# Patient Record
Sex: Male | Born: 1970 | Race: White | Hispanic: No | Smoking: Current every day smoker
Health system: Southern US, Community
[De-identification: ages and names within clinical notes are randomized; demographics above are authoritative.]

## PROBLEM LIST (undated history)

## (undated) DIAGNOSIS — I340 Nonrheumatic mitral (valve) insufficiency: Secondary | ICD-10-CM

## (undated) DIAGNOSIS — G43109 Migraine with aura, not intractable, without status migrainosus: Secondary | ICD-10-CM

## (undated) DIAGNOSIS — J45909 Unspecified asthma, uncomplicated: Secondary | ICD-10-CM

## (undated) DIAGNOSIS — Z720 Tobacco use: Secondary | ICD-10-CM

## (undated) DIAGNOSIS — G8929 Other chronic pain: Secondary | ICD-10-CM

## (undated) DIAGNOSIS — F32A Depression, unspecified: Secondary | ICD-10-CM

## (undated) DIAGNOSIS — I10 Essential (primary) hypertension: Secondary | ICD-10-CM

---

## 1997-12-31 ENCOUNTER — Emergency Department (HOSPITAL_COMMUNITY): Admission: EM | Admit: 1997-12-31 | Discharge: 1997-12-31 | Payer: Self-pay | Admitting: Emergency Medicine

## 1998-01-06 ENCOUNTER — Ambulatory Visit (HOSPITAL_BASED_OUTPATIENT_CLINIC_OR_DEPARTMENT_OTHER): Admission: RE | Admit: 1998-01-06 | Discharge: 1998-01-06 | Payer: Self-pay | Admitting: Otolaryngology

## 2000-02-18 ENCOUNTER — Encounter: Payer: Self-pay | Admitting: Emergency Medicine

## 2000-02-18 ENCOUNTER — Emergency Department (HOSPITAL_COMMUNITY): Admission: EM | Admit: 2000-02-18 | Discharge: 2000-02-18 | Payer: Self-pay | Admitting: Emergency Medicine

## 2001-03-01 ENCOUNTER — Emergency Department (HOSPITAL_COMMUNITY): Admission: EM | Admit: 2001-03-01 | Discharge: 2001-03-01 | Payer: Self-pay | Admitting: Emergency Medicine

## 2003-08-07 ENCOUNTER — Emergency Department (HOSPITAL_COMMUNITY): Admission: EM | Admit: 2003-08-07 | Discharge: 2003-08-07 | Payer: Self-pay | Admitting: Emergency Medicine

## 2005-09-18 ENCOUNTER — Inpatient Hospital Stay (HOSPITAL_COMMUNITY): Admission: EM | Admit: 2005-09-18 | Discharge: 2005-09-19 | Payer: Self-pay | Admitting: Emergency Medicine

## 2005-09-19 ENCOUNTER — Inpatient Hospital Stay (HOSPITAL_COMMUNITY): Admission: AD | Admit: 2005-09-19 | Discharge: 2005-09-24 | Payer: Self-pay | Admitting: *Deleted

## 2005-09-19 ENCOUNTER — Ambulatory Visit: Payer: Self-pay | Admitting: *Deleted

## 2009-09-25 ENCOUNTER — Emergency Department (HOSPITAL_COMMUNITY): Admission: EM | Admit: 2009-09-25 | Discharge: 2009-09-25 | Payer: Self-pay | Admitting: Emergency Medicine

## 2010-08-30 LAB — COMPREHENSIVE METABOLIC PANEL
ALT: 38 U/L (ref 0–53)
AST: 34 U/L (ref 0–37)
Albumin: 4.4 g/dL (ref 3.5–5.2)
Alkaline Phosphatase: 90 U/L (ref 39–117)
BUN: 9 mg/dL (ref 6–23)
CO2: 28 mEq/L (ref 19–32)
Calcium: 9 mg/dL (ref 8.4–10.5)
Chloride: 102 mEq/L (ref 96–112)
Creatinine, Ser: 0.9 mg/dL (ref 0.4–1.5)
GFR calc Af Amer: >60 mL/min (ref >60)
GFR calc non Af Amer: >60 mL/min (ref >60)
Glucose, Bld: 100 mg/dL — ABNORMAL HIGH (ref 70–99)
Potassium: 4 mEq/L (ref 3.5–5.1)
Sodium: 137 mEq/L (ref 135–145)
Total Bilirubin: 0.8 mg/dL (ref 0.3–1.2)
Total Protein: 7.9 g/dL (ref 6.0–8.3)

## 2010-08-30 LAB — URINALYSIS, ROUTINE W REFLEX MICROSCOPIC
Bilirubin Urine: NEGATIVE
Glucose, UA: NEGATIVE mg/dL
Hgb urine dipstick: NEGATIVE
Ketones, ur: NEGATIVE mg/dL
Nitrite: NEGATIVE
Protein, ur: NEGATIVE mg/dL
Specific Gravity, Urine: 1.019 (ref 1.005–1.030)
Urobilinogen, UA: 0.2 mg/dL (ref 0.0–1.0)
pH: 5.5 (ref 5.0–8.0)

## 2010-08-30 LAB — CBC
HCT: 52.1 % — ABNORMAL HIGH (ref 39.0–52.0)
Hemoglobin: 17.5 g/dL — ABNORMAL HIGH (ref 13.0–17.0)
MCHC: 33.6 g/dL (ref 30.0–36.0)
MCV: 101.1 fL — ABNORMAL HIGH (ref 78.0–100.0)
Platelets: 221 10*3/uL (ref 150–400)
RBC: 5.16 MIL/uL (ref 4.22–5.81)
RDW: 14.3 % (ref 11.5–15.5)
WBC: 11.4 10*3/uL — ABNORMAL HIGH (ref 4.0–10.5)

## 2010-08-30 LAB — POCT I-STAT, CHEM 8
Chloride: 101 mEq/L (ref 96–112)
Glucose, Bld: 99 mg/dL (ref 70–99)
HCT: 58 % — ABNORMAL HIGH (ref 39.0–52.0)
Hemoglobin: 19.7 g/dL — ABNORMAL HIGH (ref 13.0–17.0)
Potassium: 4.1 mEq/L (ref 3.5–5.1)
Sodium: 139 mEq/L (ref 135–145)

## 2010-08-30 LAB — DIFFERENTIAL
Basophils Absolute: 0.1 10*3/uL (ref 0.0–0.1)
Lymphocytes Relative: 16 % (ref 12–46)
Monocytes Absolute: 1.3 10*3/uL — ABNORMAL HIGH (ref 0.1–1.0)
Neutro Abs: 8.1 10*3/uL — ABNORMAL HIGH (ref 1.7–7.7)
Neutrophils Relative %: 71 % (ref 43–77)

## 2010-08-30 LAB — LIPASE, BLOOD: Lipase: 25 U/L (ref 11–59)

## 2010-10-28 NOTE — Discharge Summary (Signed)
[name], [name] NO.:  [id number]   MEDICAL RECORD NO.:  [id number]          PATIENT TYPE:  IPS   LOCATION:  0306                          FACILITY:  [facility]   PHYSICIAN:  Jasmine Pang, M.D. DATE OF BIRTH:  Oct 10, 1970   DATE OF ADMISSION:  09/19/2005  DATE OF DISCHARGE:  09/24/2005                                 DISCHARGE SUMMARY   IDENTIFICATION:  This is a 40 year old single Caucasian male who was  admitted on a voluntary basis on September 19, 2005.   HISTORY OF PRESENT ILLNESS:  The patient has a history of overdose on Xanax.  He was also drinking seven beers.  He stated he was wanting attention.  He  felt his girlfriend was cheating on him.  He states that he actually spit  the Xanax out.  He may have swallowed some, approximately 1-2 of the (?  Dose).  He was recently started on medications.  He states he never intends  to do this again.  He has too much to live for and wants to be a better  father.  He discussed his depression.  He says he assumes things are  happening that are not, for example feeling his girlfriend was cheating on  him when she was not.  There were a lot of major stressors building up.  He  had been alienated from his mother who has depression.  He was started on  Prozac last week by his primary care doctor.  He has also been alienated  from his father.  In addition, he has had been out of work for two weeks.   PAST PSYCHIATRIC HISTORY:  This is the first Behavioral Health admission for  patient.  He is seen by a clinical psychologist, Dr. Gretchen Short.  He has had  no other suicide attempts.   FAMILY HISTORY:  Mother has depression.   SUBSTANCE ABUSE HISTORY:  Smokes.  Occasional alcohol.  Denies drug use.   PRIMARY CARE PHYSICIAN:  Dr. Tenny Craw.   MEDICAL PROBLEMS:  The patient is healthy.   MEDICATIONS:  Prozac 20 mg for the past week.   ALLERGIES:  MECLIZINE.   PHYSICAL EXAMINATION:  A physical exam was done by our nurse  practitioner,  Landry Corporal, N.P.  There were no acute medical problems.   LABORATORY DATA:  UDS was positive for benzodiazepines.  Alcohol was 195,  salicylate less than 4, Tylenol less than 10, magnesium 2.4.   HOSPITAL COURSE:  Upon admission, the patient was placed on Protonix 40 mg  p.o. q.d., multivitamin with minerals 1 tablet p.o. q.d., Prozac 20 mg p.o.  q.d., Librium 25 mg p.o. q.6h. p.r.n. anxiety or symptoms of withdrawal,  Ambien 5 mg 1 p.o. q.h.s. p.r.n., may repeat x1 if needed.  On September 19, 2005, the Ambien was discontinued.  He was placed on trazodone 50 mg q.h.s.  p.r.n. insomnia, may repeat x1 if necessary.  The results on order that the  patient could use Nicorette gum.  However, later on September 19, 2005, the  nicotine gum was discontinued as he stated it was hurting  his jaws.  Instead, a nicotine patch 21 mg was applied and smoking cessation protocol  begun.  On September 21, 2005, he was started on ibuprofen 600 mg p.o. q.6h.  p.r.n. pain.  On September 23, 2005, trazodone was increased to 100 mg p.o.  q.h.s. p.r.n. insomnia.   The patient talked openly about his depression.  He admitted that his  perceptions are somewhat distorted, for example he thinks his girlfriend is  cheating when she is not.  He discussed his multiple psychosocial stressors  as listed in the history of present illness.  He minimized his alcohol use.  On September 21, 2005, I met with the patient and his sister, who gave a more  thorough history of his severe alcohol dependence and binges.  Prior to  admission, he took a whole bottle of Xanax and drank liquor.  The patient  was not happy that his sister was revealing this but remained cooperative.  The patient continued to be focused on discharge.  He likes Dr. Grant Fontana.  He  states he was still somewhat in denial about his recovery.  He felt he would  do well because he had hit bottom.  I advised there was a lot of work to do  in order to recover.  His  sister had told him that his girlfriend was  leaving and he stated he had suspected this was going to happen.   On September 22, 2005, the counselor met with the patient and the patient's  sister.  The patient stated he did not have a desire to drink anymore.  He  stated he had been drinking for about 16 years.  The patient's sister stated  that she wants to be very involved in the patient's recovery.  She lives  next door to the patient and she wants him to follow up with his  appointments and go to his AA meetings.  He told her he did not want her to  pressure him with his recovery.  He stated he had just ended a 3-year  relationship with his girlfriend and that has been very difficult for him to  deal with.  He also stated he wanted to be a better father to his daughter.  He is planning to find another job because the job he has is very stressful  for him.  He stated he would not be around his friends that drink.  The  patient's sister and their mother are very supportive.   At the time of discharge, the patient's mental status had improved markedly.  He had less psychomotor retardation and was more engaged.  His mood had  become less depressed and anxious.  His affect was wider range.  He denies  suicidal or homicidal ideation.  There were no auditory or visual  hallucinations.  There was no paranoia or delusions noted.  Thought  processes were logical and goal-directed.  Thought content:  wants to get  out of the hospital.  Cognitive was grossly within normal limits.  At the  time of discharge, the patient was scheduled to go to the CD IOP.  He has a  relapse prevention plan in place.   ACTIVITY/DIET:  There were no specific activity level or dietary  restrictions.   DISCHARGE MEDICATIONS:  1.  Protonix 40 mg q.d.  2.  Prozac 20 mg q.d.  3.  Trazodone 100 mg p.o. q.h.s.   POST-HOSPITAL CARE PLANS:  [facility] IOP at the [facility].  He was to start  September 25, 2005 at 10:30 a.m. for  registration.  After the CD IOP program, he will continue his treatment with Dr. Gretchen Short  and will also see Dr. Lolly Mustache for medication management.      Jasmine Pang, M.D.  Electronically Signed     BHS/MEDQ  D:  10/10/2005  T:  10/10/2005  Job:  [id number]

## 2010-10-28 NOTE — H&P (Signed)
Martin Mendoza, Martin Mendoza             ACCOUNT NO.:  [id number]   MEDICAL RECORD NO.:  [id number]          PATIENT TYPE:  EMS   LOCATION:  MAJO                         FACILITY:  [facility]   PHYSICIAN:  Isidor Holts, M.D.  DATE OF BIRTH:  [birth year]   DATE OF ADMISSION:  09/17/2005  DATE OF DISCHARGE:                                HISTORY & PHYSICAL   PRIMARY CARE PHYSICIAN:  Unassigned.   CHIEF COMPLAINT:  Overdose of several tablets of Xanax, with alcohol  ingestion.   HISTORY OF PRESENT ILLNESS:  This is a 40 year old male.  History is not  very clear, as the patient is very drowsy and not communicative.  However,  what is known of the history is gleaned from the emergency department M.D.  and also the EMS report.  It appears that family called EMS after the  patient had ingested approximately 25 tablets of 0.5 mg Xanax, following  trouble with his girlfriend.  He also consumed an unspecified amount of  alcohol.  He was very drowsy on initial evaluation in the emergency  department, but did not appear to be in obvious respiratory distress.  There  was no vomiting.   PAST MEDICAL HISTORY:  1.  Depression.  2.  Previous history of suicide attempt/Drug overdose.  3.  Alcohol abuse.   MEDICATIONS:  1.  Prozac, query dosage.  2.  Xanax, query dosage.   ALLERGIES:  No known drug allergies.   REVIEW OF SYSTEMS:  Unobtainable at present.   FAMILY HISTORY:   SOCIAL HISTORY:  Unobtainable at present.   PHYSICAL EXAMINATION:  VITAL SIGNS:  Temperature 99.0, pulse 87 per minute  and regular, respiratory rate 28, blood pressure 124/77 mmHg, pulse oximetry  97% on room air.  GENERAL:  The patient does not appear to be in obvious acute respiratory  distress, however, he is very drowsy and somewhat difficult to rouse.  Once  roused, he quickly falls asleep again.  He is therefore not able to  contribute history.  HEENT:  No clinical pallor, no jaundice, and no conjunctival  injection.  NECK:  Supple, JVP not seen, no palpable lymphadenopathy, and no palpable  goiter.  CHEST:  Clinically clear to auscultation.  No wheezes and no crackles.  HEART:  Heart sounds 1 and 2 heard, normal, regular, no murmurs.  ABDOMEN:  Flat, soft, and nontender with no palpable organomegaly.  No  palpable masses.  Normal bowel sounds.  EXTREMITIES:  Quiet unremarkable.  MUSCULOSKELETAL:  Not formally examined, however, appears unremarkable.  NEUROLOGY:  Apart from drowsiness, no focal neurologic deficit is elicited.   LABORATORY DATA:  CBC; WBC 10.9, hemoglobin 15.9, hematocrit 45.4, platelets  295.  Electrolytes; sodium 137, potassium 3.9, chloride 106, CO2 24.9, BUN  8, creatinine 0.9, glucose 88.  LFT's are normal.  Urine drug screen is  positive only for benzodiazepines, acetaminophen is less than 10.0,  salicylate less than 4.0, alcohol level is 195.   ASSESSMENT:  1.  Benzodiazepine overdose.  The patient does not appear to be in      respiratory distress at present.  Lung fields are  clear.  We shall admit      to telemetry for observation.   1.  Suicide attempt.  The patient will need one-on-one sitter.  We shall      request psychiatry consultation, once the patient is medically stable.   1.  Depression. See #2 above.   1.  Acute alcoholic intoxication/Alcohol abuse.  We will place the patient      on intravenous fluids, vitamin supplements, watch for alcohol withdrawal      phenomena and institute appropriate counseling.  Further management will depend on clinical course.      Isidor Holts, M.D.  Electronically Signed     CO/MEDQ  D:  09/18/2005  T:  09/18/2005  Job:  [id number]

## 2010-10-28 NOTE — Discharge Summary (Signed)
Martin Mendoza, Martin Mendoza             ACCOUNT NO.:  [id number]   MEDICAL RECORD NO.:  [id number]          PATIENT TYPE:  INP   LOCATION:  4711                         FACILITY:  [facility]   PHYSICIAN:  Hillery Aldo, M.D.   DATE OF BIRTH:  1971/01/15   DATE OF ADMISSION:  09/17/2005  DATE OF DISCHARGE:                           DISCHARGE SUMMARY - REFERRING   DATE OF DISCHARGE:  Pending psychiatric bed availability but cleared for  discharge on September 18, 2005.   PRIMARY CARE PHYSICIAN:  Dr. Tenny Craw.   DISCHARGE DIAGNOSES:  1.  Benzodiazepine overdose.  2.  Alcohol intoxication.  3.  Suicidal ideation and attempt.  4.  Depression.  5.  Tobacco abuse.   DISCHARGE MEDICATIONS:  1.  Thiamine 100 mg daily.  2.  Multivitamin daily.  3.  Prozac 20 mg daily.  4.  Ativan per alcohol withdrawal management protocol.   CONSULTATION:  Antonietta Breach, M.D., psychiatry.   PROCEDURES/DIAGNOSTIC STUDIES:  None.   DISCHARGE LABORATORY DATA:  Urine drug screen was positive for  benzodiazepines.  Alcohol level was 195.  Salicylates were less than 4.  Acetaminophen less than 10.  Other laboratory values as per admission HPI.   HOSPITAL COURSE BY PROBLEM:  PROBLEM #1 -  SUICIDE ATTEMPT/BENZODIAZEPINE  OVERDOSE/ALCOHOL INTOXICATION:  Patient was admitted for observation.  He  was initially very drowsy and was kept on close observation status with a  1:1 sitter.  He was monitored for signs of alcohol withdrawal.  He was put  on a alcohol withdrawal protocol.  Once he was awake and alert, he was seen  by Eduard Roux, NP, of psychiatry.  Felt that the patient met the criteria  for inpatient psychiatric commitment.  Paperwork was therefore completed for  petition to commitment.  At This point, he is stable from a medical  standpoint for transfer to inpatient psychiatric facility.  We are awaiting  a bed offer at this time.   DISPOSITION:  Once the patient has a bed offer, he will be transferred to an  inpatient psychiatric facility for ongoing management of his psychiatric  issues.           ______________________________  Hillery Aldo, M.D.     CR/MEDQ  D:  09/18/2005  T:  09/18/2005  Job:  [id number]   cc:   Dr. Tenny Craw

## 2010-10-28 NOTE — H&P (Signed)
Martin Mendoza, Martin Mendoza NO.:  [id number]   MEDICAL RECORD NO.:  [id number]          PATIENT TYPE:  IPS   LOCATION:  0306                          [facility]ILITY[facility]  [facility]   PHYSICIAN:  Martin Mendoza, M.D. DATE OF BIRTH:  02/15/1971   DATE OF ADMISSION:  09/19/2005  DATE OF DISCHARGE:                         PSYCHIATRIC ADMISSION ASSESSMENT   IDENTIFYING INFORMATION:  A 40 year old single white male, voluntarily  admitted September 19, 2005.   HISTORY OF PRESENT ILLNESS:  The patient presents with a history of  intentional overdose, overdosing on Xanax.  The patient has been drinking  about 7 beers.  The patient states that he did this because he wanted  attention.  He felt that his girlfriend was cheating on him.  He states that  he actually only took a few Xanax and spit the rest out, swallowing only  maybe one or two.  The patient states he recently started on some  medications.  He states that he would never attempt to harm himself again.  He states he has too much to live for.  He wants to be a better father and  wants to stop drinking.  Denies any psychotic symptoms.   PAST PSYCHIATRIC HISTORY:  This is his first admission to [facility], no other psychiatric admissions or any other suicide attempts or  gestures.  He is seeing a clinical psychologist, Dr. Gretchen Short.   SOCIAL HISTORY:  He is a 40 year old single white male, has a 39 year old  child.  The child is with the mother of that child.  He was living with his  girlfriend.  He works as a Tax adviser.   FAMILY HISTORY:  Mother with depression .   ALCOHOL DRUG HISTORY:  The patient smokes, he states he drinks on occasion,  denies any drug use.   PAST MEDICAL HISTORY:  Primary care Tandi Hanko is Dr. Tenny Craw.  Medical problems:  The patient states he is healthy, no chronic illness.   MEDICATIONS:  Has been on Prozac 20 mg for one week.   DRUG ALLERGIES:  MECLOFEN   PHYSICAL EXAMINATION:  The patient  was assessed at [facility].  This is a young male in no acute distress.  He is healthy appearing.  Review  of systems is positive for depression, positive for substance abuse,  remainder of systems are noncontributory.  Heart rate 66, respiratory rate  20, blood pressure 136/82, 194 pounds, approximately 5 feet 10 inches tall.  Urine drug screen is positive for benzodiazepines.  Alcohol level was 195 in  the emergency room.  Salicylate level less than 4, acetaminophen level less  than 10.  Magnesium was 2.4.  His CBC is within normal limits.  His CMET  also is within normal limits.   MENTAL STATUS EXAM:  He is an alert, cooperative young male, good eye  contact, casually dressed.  Speech is clear, normal rate and tone,  articulate.  Mood is anxious.  The patient is pleasant, seems very agreeable  to recommendations.  Thought processes are coherent, there is no evidence of  psychosis, no delusional thinking.  Cognitive  function intact.  Memory is  good, judgment is fair, insight is fair, poor impulse control, concentration  intact.  Appears to have average to above average intelligence.   ADMISSION DIAGNOSES:  AXIS I:  Mood disorder not otherwise specified, rule  out alcohol abuse.  AXIS II:  Deferred.  AXIS III:  Healthy.  AXIS IV:  Other psychosocial problems, possible problems with girlfriend.  AXIS V:  Current is 30.   PLAN:  Plan is to stabilize mood and thinking, contract for safety, detox  the patient with Librium.  Risks and benefits of detox was discussed with  the patient.  The patient is agreeable.  The patient is to increase coping  skills by attending individual and group therapy.  We will consider a family  session with the patient's support group.  The patient is to follow up with  possible CDIOP program and continue with the Prozac.   TENTATIVE LENGTH OF CARE:  4-5 days.      Martin Mendoza, N.P.      Martin Mendoza, M.D.  Electronically  Signed    JO/MEDQ  D:  09/21/2005  T:  09/21/2005  Job:  [id number]

## 2021-09-11 ENCOUNTER — Inpatient Hospital Stay (HOSPITAL_BASED_OUTPATIENT_CLINIC_OR_DEPARTMENT_OTHER)
Admission: EM | Admit: 2021-09-11 | Discharge: 2021-09-13 | DRG: 917 | Disposition: A | Discharge status: Home / Self Care | Payer: Self-pay | Attending: Family Medicine | Admitting: Family Medicine

## 2021-09-11 ENCOUNTER — Encounter (HOSPITAL_BASED_OUTPATIENT_CLINIC_OR_DEPARTMENT_OTHER): Payer: Self-pay | Admitting: Urology

## 2021-09-11 ENCOUNTER — Other Ambulatory Visit: Payer: Self-pay

## 2021-09-11 ENCOUNTER — Emergency Department (HOSPITAL_BASED_OUTPATIENT_CLINIC_OR_DEPARTMENT_OTHER): Payer: Self-pay

## 2021-09-11 DIAGNOSIS — Y929 Unspecified place or not applicable: Secondary | ICD-10-CM

## 2021-09-11 DIAGNOSIS — F10129 Alcohol abuse with intoxication, unspecified: Secondary | ICD-10-CM | POA: Diagnosis present

## 2021-09-11 DIAGNOSIS — G928 Other toxic encephalopathy: Secondary | ICD-10-CM | POA: Diagnosis present

## 2021-09-11 DIAGNOSIS — I340 Nonrheumatic mitral (valve) insufficiency: Secondary | ICD-10-CM | POA: Diagnosis present

## 2021-09-11 DIAGNOSIS — T50902A Poisoning by unspecified drugs, medicaments and biological substances, intentional self-harm, initial encounter: Principal | ICD-10-CM

## 2021-09-11 DIAGNOSIS — Z20822 Contact with and (suspected) exposure to covid-19: Secondary | ICD-10-CM | POA: Diagnosis present

## 2021-09-11 DIAGNOSIS — I10 Essential (primary) hypertension: Secondary | ICD-10-CM | POA: Diagnosis present

## 2021-09-11 DIAGNOSIS — F17210 Nicotine dependence, cigarettes, uncomplicated: Secondary | ICD-10-CM | POA: Diagnosis present

## 2021-09-11 DIAGNOSIS — Z5900 Homelessness unspecified: Secondary | ICD-10-CM

## 2021-09-11 DIAGNOSIS — F10139 Alcohol abuse with withdrawal, unspecified: Secondary | ICD-10-CM | POA: Diagnosis present

## 2021-09-11 DIAGNOSIS — T481X2A Poisoning by skeletal muscle relaxants [neuromuscular blocking agents], intentional self-harm, initial encounter: Principal | ICD-10-CM | POA: Diagnosis present

## 2021-09-11 DIAGNOSIS — Z9151 Personal history of suicidal behavior: Secondary | ICD-10-CM

## 2021-09-11 DIAGNOSIS — T50901A Poisoning by unspecified drugs, medicaments and biological substances, accidental (unintentional), initial encounter: Secondary | ICD-10-CM | POA: Diagnosis present

## 2021-09-11 DIAGNOSIS — Z888 Allergy status to other drugs, medicaments and biological substances status: Secondary | ICD-10-CM

## 2021-09-11 DIAGNOSIS — J9601 Acute respiratory failure with hypoxia: Secondary | ICD-10-CM | POA: Diagnosis present

## 2021-09-11 DIAGNOSIS — G894 Chronic pain syndrome: Secondary | ICD-10-CM | POA: Diagnosis present

## 2021-09-11 DIAGNOSIS — F32A Depression, unspecified: Secondary | ICD-10-CM | POA: Diagnosis present

## 2021-09-11 DIAGNOSIS — Y908 Blood alcohol level of 240 mg/100 ml or more: Secondary | ICD-10-CM | POA: Diagnosis present

## 2021-09-11 DIAGNOSIS — R0689 Other abnormalities of breathing: Secondary | ICD-10-CM

## 2021-09-11 HISTORY — DX: Essential (primary) hypertension: I10

## 2021-09-11 HISTORY — DX: Other chronic pain: G89.29

## 2021-09-11 HISTORY — DX: Tobacco use: Z72.0

## 2021-09-11 HISTORY — DX: Unspecified asthma, uncomplicated: J45.909

## 2021-09-11 HISTORY — DX: Depression, unspecified: F32.A

## 2021-09-11 HISTORY — DX: Nonrheumatic mitral (valve) insufficiency: I34.0

## 2021-09-11 HISTORY — DX: Migraine with aura, not intractable, without status migrainosus: G43.109

## 2021-09-11 LAB — COMPREHENSIVE METABOLIC PANEL
ALT: 35 U/L (ref 0–44)
AST: 37 U/L (ref 15–41)
Albumin: 4.6 g/dL (ref 3.5–5.0)
Alkaline Phosphatase: 83 U/L (ref 38–126)
Anion gap: 10 (ref 5–15)
BUN: 6 mg/dL (ref 6–20)
CO2: 30 mmol/L (ref 22–32)
Calcium: 9.2 mg/dL (ref 8.9–10.3)
Chloride: 99 mmol/L (ref 98–111)
Creatinine, Ser: 0.79 mg/dL (ref 0.61–1.24)
GFR, Estimated: >60 mL/min (ref >60)
Glucose, Bld: 82 mg/dL (ref 70–99)
Potassium: 3.8 mmol/L (ref 3.5–5.1)
Sodium: 139 mmol/L (ref 135–145)
Total Bilirubin: 0.9 mg/dL (ref 0.3–1.2)
Total Protein: 8.3 g/dL — ABNORMAL HIGH (ref 6.5–8.1)

## 2021-09-11 LAB — CBC WITH DIFFERENTIAL/PLATELET
Abs Immature Granulocytes: 0.21 10*3/uL — ABNORMAL HIGH (ref 0.00–0.07)
Basophils Absolute: 0.1 10*3/uL (ref 0.0–0.1)
Basophils Relative: 1 %
Eosinophils Absolute: 0.5 10*3/uL (ref 0.0–0.5)
Eosinophils Relative: 4 %
HCT: 50.9 % (ref 39.0–52.0)
Hemoglobin: 17.4 g/dL — ABNORMAL HIGH (ref 13.0–17.0)
Immature Granulocytes: 2 %
Lymphocytes Relative: 35 %
Lymphs Abs: 3.9 10*3/uL (ref 0.7–4.0)
MCH: 34.2 pg — ABNORMAL HIGH (ref 26.0–34.0)
MCHC: 34.2 g/dL (ref 30.0–36.0)
MCV: 100 fL (ref 80.0–100.0)
Monocytes Absolute: 1.1 10*3/uL — ABNORMAL HIGH (ref 0.1–1.0)
Monocytes Relative: 10 %
Neutro Abs: 5.4 10*3/uL (ref 1.7–7.7)
Neutrophils Relative %: 48 %
Platelets: 212 10*3/uL (ref 150–400)
RBC: 5.09 MIL/uL (ref 4.22–5.81)
RDW: 13.1 % (ref 11.5–15.5)
WBC: 11.1 10*3/uL — ABNORMAL HIGH (ref 4.0–10.5)
nRBC: 0 % (ref 0.0–0.2)

## 2021-09-11 LAB — I-STAT ARTERIAL BLOOD GAS, ED
Acid-base deficit: 1 mmol/L (ref 0.0–2.0)
Bicarbonate: 24 mmol/L (ref 20.0–28.0)
Calcium, Ion: 1.13 mmol/L — ABNORMAL LOW (ref 1.15–1.40)
HCT: 47 % (ref 39.0–52.0)
Hemoglobin: 16 g/dL (ref 13.0–17.0)
O2 Saturation: 91 %
Patient temperature: 96.1
Potassium: 3.7 mmol/L (ref 3.5–5.1)
Sodium: 141 mmol/L (ref 135–145)
TCO2: 25 mmol/L (ref 22–32)
pCO2 arterial: 36.2 mmHg (ref 32–48)
pH, Arterial: 7.423 (ref 7.35–7.45)
pO2, Arterial: 54 mmHg — ABNORMAL LOW (ref 83–108)

## 2021-09-11 LAB — URINALYSIS, ROUTINE W REFLEX MICROSCOPIC
Bilirubin Urine: NEGATIVE
Glucose, UA: NEGATIVE mg/dL
Hgb urine dipstick: NEGATIVE
Ketones, ur: NEGATIVE mg/dL
Leukocytes,Ua: NEGATIVE
Nitrite: NEGATIVE
Protein, ur: NEGATIVE mg/dL
Specific Gravity, Urine: <=1.005 (ref 1.005–1.030)
pH: 5.5 (ref 5.0–8.0)

## 2021-09-11 LAB — ETHANOL: Alcohol, Ethyl (B): 282 mg/dL — ABNORMAL HIGH (ref <10)

## 2021-09-11 LAB — RAPID URINE DRUG SCREEN, HOSP PERFORMED
Amphetamines: NOT DETECTED
Barbiturates: NOT DETECTED
Benzodiazepines: NOT DETECTED
Cocaine: NOT DETECTED
Opiates: NOT DETECTED
Tetrahydrocannabinol: NOT DETECTED

## 2021-09-11 LAB — RESP PANEL BY RT-PCR (FLU A&B, COVID) ARPGX2
Influenza A by PCR: NEGATIVE
Influenza B by PCR: NEGATIVE
SARS Coronavirus 2 by RT PCR: NEGATIVE

## 2021-09-11 LAB — GLUCOSE, CAPILLARY
Glucose-Capillary: 119 mg/dL — ABNORMAL HIGH (ref 70–99)
Glucose-Capillary: 129 mg/dL — ABNORMAL HIGH (ref 70–99)

## 2021-09-11 LAB — MAGNESIUM: Magnesium: 2.2 mg/dL (ref 1.7–2.4)

## 2021-09-11 LAB — ACETAMINOPHEN LEVEL: Acetaminophen (Tylenol), Serum: <10 ug/mL — ABNORMAL LOW (ref 10–30)

## 2021-09-11 LAB — SALICYLATE LEVEL: Salicylate Lvl: <7 mg/dL — ABNORMAL LOW (ref 7.0–30.0)

## 2021-09-11 MED ORDER — DOCUSATE SODIUM 50 MG/5ML PO LIQD
100.0000 mg | Freq: Two times a day (BID) | ORAL | Status: DC
  Administered 2021-09-12: 100 mg
  Filled 2021-09-11: qty 10

## 2021-09-11 MED ORDER — FENTANYL 2500MCG IN NS 250ML (10MCG/ML) PREMIX INFUSION: 50.0000 ug/h | INTRAVENOUS | Status: DC

## 2021-09-11 MED ORDER — FENTANYL CITRATE PF 50 MCG/ML IJ SOSY: 50.0000 ug | PREFILLED_SYRINGE | INTRAMUSCULAR | Status: DC | PRN

## 2021-09-11 MED ORDER — ENOXAPARIN SODIUM 40 MG/0.4ML IJ SOSY
40.0000 mg | PREFILLED_SYRINGE | INTRAMUSCULAR | Status: DC
  Administered 2021-09-11: 40 mg via SUBCUTANEOUS
  Filled 2021-09-11: qty 0.4

## 2021-09-11 MED ORDER — ADULT MULTIVITAMIN W/MINERALS CH
1.0000 | ORAL_TABLET | Freq: Every day | ORAL | Status: DC
  Administered 2021-09-12: 1
  Filled 2021-09-11: qty 1

## 2021-09-11 MED ORDER — FENTANYL CITRATE PF 50 MCG/ML IJ SOSY
50.0000 ug | PREFILLED_SYRINGE | INTRAMUSCULAR | Status: DC | PRN
  Administered 2021-09-11: 50 ug via INTRAVENOUS
  Filled 2021-09-11: qty 1

## 2021-09-11 MED ORDER — LACTATED RINGERS IV SOLN: INTRAVENOUS | Status: DC

## 2021-09-11 MED ORDER — FENTANYL BOLUS VIA INFUSION
50.0000 ug | INTRAVENOUS | Status: DC | PRN
  Filled 2021-09-11: qty 50

## 2021-09-11 MED ORDER — FENTANYL CITRATE PF 50 MCG/ML IJ SOSY
PREFILLED_SYRINGE | INTRAMUSCULAR | Status: AC
  Filled 2021-09-11: qty 2

## 2021-09-11 MED ORDER — CHARCOAL ACTIVATED PO LIQD
50.0000 g | Freq: Once | ORAL | Status: AC
  Administered 2021-09-11: 50 g via ORAL
  Filled 2021-09-11: qty 240

## 2021-09-11 MED ORDER — DOCUSATE SODIUM 100 MG PO CAPS: 100.0000 mg | ORAL_CAPSULE | Freq: Two times a day (BID) | ORAL | Status: DC | PRN

## 2021-09-11 MED ORDER — ROCURONIUM BROMIDE 50 MG/5ML IV SOLN
100.0000 mg | Freq: Once | INTRAVENOUS | Status: AC
  Filled 2021-09-11: qty 10

## 2021-09-11 MED ORDER — ETOMIDATE 2 MG/ML IV SOLN
INTRAVENOUS | Status: AC
  Administered 2021-09-11: 20 mg via INTRAVENOUS
  Filled 2021-09-11: qty 20

## 2021-09-11 MED ORDER — MIDAZOLAM HCL 2 MG/2ML IJ SOLN
INTRAMUSCULAR | Status: AC
  Filled 2021-09-11: qty 2

## 2021-09-11 MED ORDER — LACTATED RINGERS IV BOLUS
500.0000 mL | Freq: Once | INTRAVENOUS | Status: AC
  Administered 2021-09-11: 500 mL via INTRAVENOUS

## 2021-09-11 MED ORDER — SUCCINYLCHOLINE CHLORIDE 200 MG/10ML IV SOSY
PREFILLED_SYRINGE | INTRAVENOUS | Status: AC
  Filled 2021-09-11: qty 10

## 2021-09-11 MED ORDER — ROCURONIUM BROMIDE 10 MG/ML (PF) SYRINGE
PREFILLED_SYRINGE | INTRAVENOUS | Status: AC
  Administered 2021-09-11: 100 mg via INTRAVENOUS
  Filled 2021-09-11: qty 10

## 2021-09-11 MED ORDER — PROPOFOL 1000 MG/100ML IV EMUL: 5.0000 ug/kg/min | INTRAVENOUS | Status: DC

## 2021-09-11 MED ORDER — FOLIC ACID 1 MG PO TABS
1.0000 mg | ORAL_TABLET | Freq: Every day | ORAL | Status: DC
  Administered 2021-09-12: 1 mg
  Filled 2021-09-11: qty 1

## 2021-09-11 MED ORDER — PANTOPRAZOLE SODIUM 40 MG IV SOLR
40.0000 mg | Freq: Every day | INTRAVENOUS | Status: DC
  Administered 2021-09-11: 40 mg via INTRAVENOUS
  Filled 2021-09-11: qty 10

## 2021-09-11 MED ORDER — ETOMIDATE 2 MG/ML IV SOLN: 20.0000 mg | Freq: Once | INTRAVENOUS | Status: AC

## 2021-09-11 MED ORDER — SODIUM CHLORIDE 0.9 % IV BOLUS
1000.0000 mL | Freq: Once | INTRAVENOUS | Status: AC
  Administered 2021-09-11: 1000 mL via INTRAVENOUS

## 2021-09-11 MED ORDER — ORAL CARE MOUTH RINSE
15.0000 mL | OROMUCOSAL | Status: DC
  Administered 2021-09-11 – 2021-09-12 (×4): 15 mL via OROMUCOSAL
  Filled 2021-09-11: qty 15

## 2021-09-11 MED ORDER — CHLORHEXIDINE GLUCONATE 0.12% ORAL RINSE (MEDLINE KIT)
15.0000 mL | Freq: Two times a day (BID) | OROMUCOSAL | Status: DC
  Administered 2021-09-11 – 2021-09-13 (×4): 15 mL via OROMUCOSAL
  Filled 2021-09-11: qty 15

## 2021-09-11 MED ORDER — FENTANYL BOLUS VIA INFUSION
50.0000 ug | INTRAVENOUS | Status: DC | PRN
  Administered 2021-09-11 – 2021-09-12 (×4): 100 ug via INTRAVENOUS
  Filled 2021-09-11: qty 100

## 2021-09-11 MED ORDER — CHLORHEXIDINE GLUCONATE CLOTH 2 % EX PADS
6.0000 | MEDICATED_PAD | Freq: Every day | CUTANEOUS | Status: DC
  Administered 2021-09-11 – 2021-09-12 (×2): 6 via TOPICAL
  Filled 2021-09-11: qty 6

## 2021-09-11 MED ORDER — FENTANYL 2500MCG IN NS 250ML (10MCG/ML) PREMIX INFUSION
25.0000 ug/h | INTRAVENOUS | Status: DC
  Administered 2021-09-11: 50 ug/h via INTRAVENOUS
  Filled 2021-09-11 (×2): qty 250

## 2021-09-11 MED ORDER — PROPOFOL 1000 MG/100ML IV EMUL
INTRAVENOUS | Status: AC
  Administered 2021-09-11: 5 ug/kg/min via INTRAVENOUS
  Filled 2021-09-11: qty 100

## 2021-09-11 MED ORDER — THIAMINE HCL 100 MG/ML IJ SOLN
100.0000 mg | Freq: Every day | INTRAMUSCULAR | Status: DC
  Administered 2021-09-12: 100 mg via INTRAVENOUS
  Filled 2021-09-11: qty 2

## 2021-09-11 MED ORDER — POLYETHYLENE GLYCOL 3350 17 G PO PACK: 17.0000 g | PACK | Freq: Every day | ORAL | Status: DC | PRN

## 2021-09-11 MED ORDER — FENTANYL CITRATE PF 50 MCG/ML IJ SOSY: 50.0000 ug | PREFILLED_SYRINGE | Freq: Once | INTRAMUSCULAR | Status: DC

## 2021-09-11 MED ORDER — POLYETHYLENE GLYCOL 3350 17 G PO PACK
17.0000 g | PACK | Freq: Every day | ORAL | Status: DC
  Administered 2021-09-12: 17 g
  Filled 2021-09-11: qty 1

## 2021-09-11 NOTE — H&P (Deleted)
.  pcc

## 2021-09-11 NOTE — ED Notes (Signed)
Pt unable to drink charcoal d/t decreased mental status; EDP made aware  ?

## 2021-09-11 NOTE — ED Notes (Signed)
Patient transported to CT on cardiac monitor with RN and RT 

## 2021-09-11 NOTE — ED Notes (Signed)
Pt to rm 14 for RSI ?1805: pt responsive to pain only ?1806: [id number]: RT at bedside ; NRB applied ?1809: Etomidate 20mg  IVP given ?1810: Rocuronium 100mg  IVP given ?Pt being BVM ?[id number]: pt intubated by EDP with 8.0 ETT, 23 cm at the teeth; +color change; + breath sounds auscultated bilaterally by EDP ?1815: 16 Fr OG inserted by EDP ?1823; 16 Fr FOley cath w/  temp  placed ? ?

## 2021-09-11 NOTE — ED Provider Notes (Signed)
?[facility] EMER[facility]ENC[facility] DEPARTMENT ?Provider Note ? ? ?CSN: [id number] ?Arrival date & time: 09/11/21  1716 ? ?  ? ?History ? ?Chief Complaint  ?Patient presents with  ? Depression  ? Drug Overdose  ? ? ?Martin Mendoza is a 51 y.o. male. ? ?51 yo M with a cc of intentional flexeril overdose.  Took it about an hour ago.  Thinks he took about 20 10mg  tablets.  Newly homeless, wanted to end his depression.   ? ? ?Depression ? ?Drug Overdose ? ? ?  ? ?Home Medications ?Prior to Admission medications   ?Not on File  ?   ? ?Allergies    ?Meclizine   ? ?Review of Systems   ?Review of Systems  ?Psychiatric/Behavioral:  Positive for depression.   ? ?Physical Exam ?Updated Vital Signs ?BP 129/88   Pulse 84   Temp (!) 95.4 ?F (35.2 ?C)   Resp 14   Ht 6\' 3"  (1.905 m)   Wt 99.8 kg   SpO2 100%   BMI 27.50 kg/m?  ?Physical Exam ?Vitals and nursing note reviewed.  ?Constitutional:   ?   Appearance: He is well-developed.  ?   Comments: Clinically intoxication.   ?HENT:  ?   Head: Normocephalic and atraumatic.  ?Eyes:  ?   Pupils: Pupils are equal, round, and reactive to light.  ?Neck:  ?   Vascular: No JVD.  ?Cardiovascular:  ?   Rate and Rhythm: Normal rate and regular rhythm.  ?   Heart sounds: No murmur heard. ?  No friction rub. No gallop.  ?Pulmonary:  ?   Effort: No respiratory distress.  ?   Breath sounds: No wheezing.  ?Abdominal:  ?   General: There is no distension.  ?   Tenderness: There is no abdominal tenderness. There is no guarding or rebound.  ?Musculoskeletal:     ?   General: Normal range of motion.  ?   Cervical back: Normal range of motion and neck supple.  ?Skin: ?   Coloration: Skin is not pale.  ?   Findings: No rash.  ?Neurological:  ?   Mental Status: He is alert and oriented to person, place, and time.  ?Psychiatric:     ?   Behavior: Behavior normal.  ? ? ?ED Results / Procedures / Treatments   ?Labs ?(all labs ordered are listed, but only abnormal results are displayed) ?Labs Reviewed   ?COMPREHENSIVE METABOLIC PANEL - Abnormal; Notable for the following components:  ?    Result Value  ? Total Protein 8.3 (*)   ? All other components within normal limits  ?ETHANOL - Abnormal; Notable for the following components:  ? Alcohol, Ethyl (B) 282 (*)   ? All other components within normal limits  ?CBC WITH DIFFERENTIAL/PLATELET - Abnormal; Notable for the following components:  ? WBC 11.1 (*)   ? Hemoglobin 17.4 (*)   ? MCH 34.2 (*)   ? Monocytes Absolute 1.1 (*)   ? Abs Immature Granulocytes 0.21 (*)   ? All other components within normal limits  ?ACETAMINOPHEN LEVEL - Abnormal; Notable for the following components:  ? Acetaminophen (Tylenol), Serum <10 (*)   ? All other components within normal limits  ?SALICYLATE LEVEL - Abnormal; Notable for the following components:  ? Salicylate Lvl [id number] (*)   ? All other components within normal limits  ?URINALYSIS, ROUTINE W REFLEX MICROSCOPIC - Abnormal; Notable for the following components:  ? Color, Urine STRAW (*)   ? All  other components within normal limits  ?RESP PANEL BY RT-PCR (FLU A&B, COVID) [id number]  ?RAPID URINE DRUG SCREEN, HOSP PERFORMED  ?MAGNESIUM  ?VITAMIN B1  ?TRIGLYCERIDES  ? ? ?EKG ?None ? ?Radiology ?CT Head Wo Contrast ? ?Result Date: 09/11/2021 ?CLINICAL DATA:  Depression, concern for overdose EXAM: CT HEAD WITHOUT CONTRAST TECHNIQUE: Contiguous axial images were obtained from the base of the skull through the vertex without intravenous contrast. RADIATION DOSE REDUCTION: This exam was performed according to the departmental dose-optimization program which includes automated exposure control, adjustment of the mA and/or kV according to patient size and/or use of iterative reconstruction technique. COMPARISON:  None. FINDINGS: Brain: Areas in the right inferolateral frontal lobe (series 2, image 16 and series 4, image 50) and right anterior superior frontal lobe (series 2, image 18 and series 4, image 62) most likely encephalomalacia. No acute  hemorrhage, mass, mass effect, or midline shift. No hydrocephalus or extra-axial collection. Vascular: No hyperdense vessel. Skull: No acute osseous abnormality. Sinuses/Orbits: Complete opacification of the left maxillary sinus with partial opacification of the right maxillary sinus and anterior ethmoid air cells. The orbits are unremarkable. Other: The mastoids are well aerated. IMPRESSION: 1. 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 2. No acute intracranial process. Electronically Signed   By: Merilyn Baba M.D.   On: 09/11/2021 19:06  ? ?DG Chest Port 1 View ? ?Result Date: 09/11/2021 ?CLINICAL DATA:  Status post ET tube placement EXAM: PORTABLE CHEST 1 VIEW COMPARISON:  None FINDINGS: ETT tip in satisfactory position approximately 4.2 cm above the carina. The NG tube courses below the level of the hemidiaphragms. The tip is in the expected location of the gastric fundus. Cardiomediastinal contours appear normal. Lung volumes are low. No pleural effusion or edema. No airspace opacities identified. IMPRESSION: 1. Satisfactory position of ETT and NG tube. 2. No active cardiopulmonary abnormalities. Electronically Signed   By: Kerby Moors M.D.   On: 09/11/2021 18:46   ? ?Procedures ?Procedure Name: Intubation ?Date/Time: 09/11/2021 6:28 PM ?Performed by: Deno Etienne, DO ?Pre-anesthesia Checklist: Patient identified, Patient being monitored, Emergency Drugs available, Timeout performed and Suction available ?Oxygen Delivery Method: Non-rebreather mask ?Preoxygenation: Pre-oxygenation with 100% oxygen ?Induction Type: Rapid sequence ?Ventilation: Mask ventilation without difficulty ?Laryngoscope Size: Glidescope ?Grade View: Grade I ?Tube size: 8.0 mm ?Number of attempts: 1 ?Airway Equipment and Method: Video-laryngoscopy ?Placement Confirmation: ETT inserted through vocal cords under direct vision, CO2 detector and Breath sounds checked- equal and  bilateral ?Secured at: 23 cm ?Tube secured with: ETT holder ?Dental Injury: Teeth and Oropharynx as per pre-operative assessment  ?Difficulty Due To: Difficulty was anticipated ?Future Recommendations: Recommend- induction with short-acting agent, and alternative techniques readily available ? ? ? ?OG placement ? ?Date/Time: 09/11/2021 6:30 PM ?Performed by: Deno Etienne, DO ?Authorized by: Deno Etienne, DO  ?Consent: The procedure was performed in an emergent situation. ?Risks and benefits: risks, benefits and alternatives were discussed ?Patient identity confirmed: verbally with patient ?Time out: Immediately prior to procedure a "time out" was called to verify the correct patient, procedure, equipment, support staff and site/side marked as required. ?Local anesthesia used: no ? ?Anesthesia: ?Local anesthesia used: no ? ?Sedation: ?Patient sedated: yes ?Sedation type: moderate (conscious) sedation ?Sedatives: etomidate ? ?Patient tolerance: patient tolerated the procedure well with no immediate complications ? ?  ? ? ? ?Medications Ordered in ED ?Medications  ?charcoal activated (NO SORBITOL) (ACTIDOSE-AQUA) suspension 50 g (50 g Oral Not Given 09/11/21 1758)  ?propofol (  DIPRIVAN) 1000 MG/100ML infusion (5 mcg/kg/min ? 99.8 kg Intravenous New Bag/Given 09/11/21 1828)  ?fentaNYL (SUBLIMAZE) injection 50 mcg (has no administration in time range)  ?fentaNYL 2538mcg in NS 218mL (66mcg/ml) infusion-PREMIX (has no administration in time range)  ?fentaNYL (SUBLIMAZE) bolus via infusion 50 mcg (has no administration in time range)  ?sodium chloride 0.9 % bolus 1,000 mL (1,000 mLs Intravenous New Bag/Given 09/11/21 1828)  ?etomidate (AMIDATE) injection 20 mg (20 mg Intravenous Given 09/11/21 1809)  ?rocuronium [facility]) injection 100 mg (100 mg Intravenous Given 09/11/21 1810)  ? ? ?ED Course/ Medical Decision Making/ A&P ?  ?                        ?Medical Decision Making ?Amount and/or Complexity of Data Reviewed ?Labs:  ordered. ?Radiology: ordered. ? ?Risk ?OTC drugs. ?Prescription drug management. ? ? ?51 yo M with a chief complaint of an intentional overdose of Flexeril.  This happened reportedly about an hour ago.  He denies any symptoms with this.

## 2021-09-11 NOTE — H&P (Addendum)
? ?NAME:  Martin Mendoza, MRN:  [id number], DOB:  Apr 27, 1971, LOS: 0 ?ADMISSION DATE:  09/11/2021, CONSULTATION DATE:  09/11/2021 ?REFERRING MD:  Renae Fickle Doc, CHIEF COMPLAINT:  Drug OD  ? ?History of Present Illness:  ?Presented to the hospital following a drug overdose ?Admitted to taking about 2010 mg Flexeril tablets ? ?Ongoing depression ?Wanted to end his depression ? ?Pertinent  Medical History  ? ?Past Medical History:  ?Diagnosis Date  ? Depression   ? Hypertension   ? ?Significant Hospital Events: ?Including procedures, antibiotic start and stop dates in addition to other pertinent events   ?09/11/2021 intubated for respiratory failure ?CT head 09/11/2021 with no acute changes ? ?Interim History / Subjective:  ?Transferred from [facility] following presentation for intentional Flexeril overdose ?Attempted charcoal which he was not able to tolerate and there is a question of possible aspiration ? ?Objective   ?Blood pressure 118/63, pulse 72, temperature 97.6 ?F (36.4 ?C), temperature source Oral, resp. rate 16, height 6\' 3"  (1.905 m), weight 99.8 kg, SpO2 97 %. ?   ?Vent Mode: PRVC ?FiO2 (%):  [35 %-70 %] 70 % ?Set Rate:  [16 bmp] 16 bmp ?Vt Set:  [510 mL-650 mL] 650 mL ?PEEP:  [5 cmH20] 5 cmH20 ?Plateau Pressure:  [14 cmH20-17 cmH20] 14 cmH20  ? ?Intake/Output Summary (Last 24 hours) at 09/11/2021 2131 ?Last data filed at 09/11/2021 2020 ?Gross per 24 hour  ?Intake 999 ml  ?Output 1600 ml  ?Net -601 ml  ? ?Filed Weights  ? 09/11/21 1730  ?Weight: 99.8 kg  ? ?Examination: ?General: Middle-age, does not appear to be in distress, sedated ?HENT: Endotracheal tube in place ?Lungs: Clear breath sounds bilaterally ?Cardiovascular: S1,S2 appreciated ?Abdomen: Soft, bowel sounds appreciated ?Extremities: No clubbing, no edema ?Neuro: Sedated ?GU: Good output ? ?Resolved Hospital Problem list   ? ? ?Assessment & Plan:  ?Intentional drug overdose ?-Poison control was contacted ?-Patient appropriate for  monitoring ?-Attempt at treatment with charcoal as patient presented within about an hour of ingestion-was not tolerated ?-Psych evaluation when more stable ? ?Flexeril overdose ?-Monitor electrolytes ?-Monitor QTc ? ?Alcohol intoxication ?-Monitor closely ?-MVI, thiamine, folate ? ?Toxic encephalopathy ?-Monitor closely ? ?Hypoxemic respiratory failure ?-Continue mechanical ventilation  ?-Target TVol 6-8cc/kgIBW ?-Target Plateau Pressure < 30cm H20 ?-Target driving pressure less than 15 cm of water ?-Target PaO2 55-65: titrate PEEP/FiO2 per protocol ?-Ventilator associated pneumonia prevention protocol ? ?Possible aspiration ?-Monitor closely ?-Chest x-ray does not show any significant infiltrate at present ?-No fevers, no leukocytosis ? ?History of depression ?-Hold home medications ? ?History of hypertension ?-Hold home medications ? ?Agitation management with fentanyl and propofol ? ?Continue IV fluids ?Continue to monitor labs ? ?Best Practice (right click and "Reselect all SmartList Selections" daily)  ? ?Diet/type: NPO ?DVT prophylaxis: LMWH ?GI prophylaxis: PPI ?Lines: N/A ?Foley:  N/A ?Code Status:  full code ?Last date of multidisciplinary goals of care discussion [pending] ? ?Labs   ?CBC: ?Recent Labs  ?Lab 09/11/21 ?1744 09/11/21 ?1952  ?WBC 11.1*  --   ?NEUTROABS 5.4  --   ?HGB 17.4* 16.0  ?HCT 50.9 47.0  ?MCV 100.0  --   ?PLT 212  --   ? ? ?Basic Metabolic Panel: ?Recent Labs  ?Lab 09/11/21 ?1744 09/11/21 ?1952  ?NA 139 141  ?K 3.8 3.7  ?CL 99  --   ?CO2 30  --   ?GLUCOSE 82  --   ?BUN 6  --   ?CREATININE 0.79  --   ?CALCIUM  9.2  --   ?MG 2.2  --   ? ?GFR: ?Estimated Creatinine Clearance: 130.6 mL/min (by C-G formula based on SCr of 0.79 mg/dL). ?Recent Labs  ?Lab 09/11/21 ?1744  ?WBC 11.1*  ? ? ?Liver Function Tests: ?Recent Labs  ?Lab 09/11/21 ?1744  ?AST 37  ?ALT 35  ?ALKPHOS 83  ?BILITOT 0.9  ?PROT 8.3*  ?ALBUMIN 4.6  ? ?No results for input(s): LIPASE, AMYLASE in the last 168 hours. ?No results  for input(s): AMMONIA in the last 168 hours. ? ?ABG ?   ?Component Value Date/Time  ? PHART 7.423 09/11/2021 1952  ? PCO2ART 36.2 09/11/2021 1952  ? PO2ART 54 (L) 09/11/2021 1952  ? HCO3 24.0 09/11/2021 1952  ? TCO2 25 09/11/2021 1952  ? ACIDBASEDEF 1.0 09/11/2021 1952  ? O2SAT 91 09/11/2021 1952  ?  ? ?Coagulation Profile: ?No results for input(s): INR, PROTIME in the last 168 hours. ? ?Cardiac Enzymes: ?No results for input(s): CKTOTAL, CKMB, CKMBINDEX, TROPONINI in the last 168 hours. ? ?HbA1C: ?No results found for: HGBA1C ? ?CBG: ?No results for input(s): GLUCAP in the last 168 hours. ? ?Review of Systems:   ?Unobtainable ? ?Past Medical History:  ?He,  has a past medical history of Depression and Hypertension.  ? ?Surgical History:  ?History reviewed. No pertinent surgical history.  ? ?Social History:  ? reports that he has been smoking cigarettes. He has been smoking an average of 1 pack per day. He has never used smokeless tobacco. He reports current alcohol use. He reports that he does not use drugs.  ? ?Family History:  ?His family history is not on file.  ? ?Allergies ?Allergies  ?Allergen Reactions  ? Meclizine   ?  ? ?Home Medications  ?Prior to Admission medications   ?Not on File  ?  ?The patient is critically ill with multiple organ systems dysfunction and requires high complexity decision making for assessment and support, frequent evaluation and titration of therapies, application of advanced monitoring technologies and extensive interpretation of multiple databases. Critical Care Time devoted to patient care services described in this note independent of APP/resident time (if applicable)  is 31 minutes.  ? ?Virl Diamond MD ?[facility] ?Personal pager: See Loretha Stapler ?If unanswered, please page ?CCM On-call: #[phone] ? ?

## 2021-09-11 NOTE — ED Notes (Signed)
Pt moved to room 14. Pt sluggish, charcoal running from patients mouth, concern for aspiration. ?

## 2021-09-11 NOTE — ED Notes (Signed)
ED Provider at bedside. 

## 2021-09-11 NOTE — ED Notes (Signed)
Staffing called for sitter request for safety. No available sitter currently. ED tech will function as Scientist, research (physical sciences) for safety.  ?

## 2021-09-11 NOTE — ED Triage Notes (Signed)
States worsening depression over past 2 weeks ?Pt states he got evicted from apartment, homeless since last Thursday  ?States took 60  flexeril 10mg  approx 1 hr PTA ?Denies SI at this time, states took the pills to feel better ?ETOH daily- states 2 beers today  ?Takes prozac 20 mg daily  ?

## 2021-09-11 NOTE — ED Notes (Signed)
ETT moved to 24 cm at the teeth by RT ?

## 2021-09-11 NOTE — ED Notes (Signed)
Warm blankets applied.  Tolerating change in propofol dose ?

## 2021-09-11 NOTE — Progress Notes (Addendum)
eLink Physician-Brief Progress Note ?Patient Name: Martin Mendoza ?DOB: 1971/05/08 ?MRN: [id number] ? ? ?Date of Service ? 09/11/2021  ?HPI/Events of Note ? Brief New admit note: ?52 yo M with a cc of intentional flexeril overdose.  Took it about an hour ago.  Thinks he took about 20 10mg  tablets.  Newly homeless, wanted to end his depression. Intoxicated from alcohol and loc [facility]. Possible aspiration of charcoal, intubated from ED.ET tube advanced in ED.  ? ?Data: ?reviewed ?7.42/36/54/25 ?CMP ok ?WBC 11 K ?Hg , UA neg ?Covid/flu neg ?Tox Urine neg ?CxR film seen: ET and OG in place, no CHF or pneumonia ?CTH neg for any acute changes. 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 ? ?EKG reviewed. Sinus, QTC 454. No acute changes. Q wave inferior, probably old.  ?Alcohol > 200, salicylates < 7 ? ?Camera evaluation done: ?On propofol 15 , fenta gtt not up yet. ?In synchrony with Vent. ?S/p charcoal from ED, possibly aspirated. On vent.  ?MAP 80, HR 75. Sats 92%. ?650( < 8 ml/ibw)/10/24/68% ?PIP < 25 ? ?A/P: ? ?Flexeril OD. Encephalopathy. Normal qtc. S/p charcoal, possibly aspirated. On Ventilator. Sedated. Poison control been contacted from ED ?CCM bed side MD notified about in ICU. ? ?VTE: consider Lovenox SQ.  ?CBG goals < 180. on SSI.  ?Vent bundle: SAT-SBT daily when stable hemodynamics. Lung protective ventilation.  ? ?Pyschiatry consult once stable.  ?Camela Wich. [birth year], MD, FCCP. ?Reatha Harps  ?eICU Interventions ?   ? ? ? ?Intervention Category ?Major Interventions: Respiratory failure - evaluation and management ?Evaluation Type: New Patient Evaluation ? ?22:38 ?DC isolation order ? ?[id number] ?09/11/2021, 8:53 PM ? ?23:16 ?BP has dropped to 79/59(63)  and Propofol has been off for 40 minutes,    now 87/58(65)     receiving LR at 100/hr . ?UOP 2 lit overall from out side and here. ?Uop getting low. ? ?- LR bolus 500 ml ?- start fentanyl gtt. ? ?Discussed with RN. Keep MAP > 65.  ? ?00:09 ?OG tube ordered. Clogging often  per RN.  ? ?02:34 ?KUB/CxR film seen. ?Ok to use NG tube. In place.  ?

## 2021-09-12 ENCOUNTER — Inpatient Hospital Stay (HOSPITAL_COMMUNITY): Payer: Self-pay

## 2021-09-12 ENCOUNTER — Encounter (HOSPITAL_COMMUNITY): Payer: Self-pay | Admitting: Pulmonary Disease

## 2021-09-12 DIAGNOSIS — R0689 Other abnormalities of breathing: Secondary | ICD-10-CM

## 2021-09-12 LAB — BLOOD GAS, ARTERIAL
Acid-base deficit: 1.1 mmol/L (ref 0.0–2.0)
Bicarbonate: 24.3 mmol/L (ref 20.0–28.0)
O2 Saturation: 99.4 %
Patient temperature: 36.7
pCO2 arterial: 41 mmHg (ref 32–48)
pH, Arterial: 7.37 (ref 7.35–7.45)
pO2, Arterial: 125 mmHg — ABNORMAL HIGH (ref 83–108)

## 2021-09-12 LAB — BASIC METABOLIC PANEL
Anion gap: 8 (ref 5–15)
BUN: 5 mg/dL — ABNORMAL LOW (ref 6–20)
CO2: 23 mmol/L (ref 22–32)
Calcium: 8.6 mg/dL — ABNORMAL LOW (ref 8.9–10.3)
Chloride: 109 mmol/L (ref 98–111)
Creatinine, Ser: 0.67 mg/dL (ref 0.61–1.24)
GFR, Estimated: >60 mL/min (ref >60)
Glucose, Bld: 101 mg/dL — ABNORMAL HIGH (ref 70–99)
Potassium: 3.7 mmol/L (ref 3.5–5.1)
Sodium: 140 mmol/L (ref 135–145)

## 2021-09-12 LAB — GLUCOSE, CAPILLARY
Glucose-Capillary: 97 mg/dL (ref 70–99)
Glucose-Capillary: 90 mg/dL (ref 70–99)
Glucose-Capillary: 91 mg/dL (ref 70–99)
Glucose-Capillary: 106 mg/dL — ABNORMAL HIGH (ref 70–99)
Glucose-Capillary: 100 mg/dL — ABNORMAL HIGH (ref 70–99)

## 2021-09-12 LAB — CBC
HCT: 44.2 % (ref 39.0–52.0)
Hemoglobin: 15 g/dL (ref 13.0–17.0)
MCH: 34.7 pg — ABNORMAL HIGH (ref 26.0–34.0)
MCHC: 33.9 g/dL (ref 30.0–36.0)
MCV: 102.3 fL — ABNORMAL HIGH (ref 80.0–100.0)
Platelets: 171 10*3/uL (ref 150–400)
RBC: 4.32 MIL/uL (ref 4.22–5.81)
RDW: 13.2 % (ref 11.5–15.5)
WBC: 7.7 10*3/uL (ref 4.0–10.5)
nRBC: 0 % (ref 0.0–0.2)

## 2021-09-12 LAB — MRSA NEXT GEN BY PCR, NASAL: MRSA by PCR Next Gen: NOT DETECTED

## 2021-09-12 LAB — PHOSPHORUS: Phosphorus: 3.7 mg/dL (ref 2.5–4.6)

## 2021-09-12 LAB — MAGNESIUM: Magnesium: 2 mg/dL (ref 1.7–2.4)

## 2021-09-12 LAB — HIV ANTIBODY (ROUTINE TESTING W REFLEX): HIV Screen 4th Generation wRfx: NONREACTIVE

## 2021-09-12 LAB — ACETAMINOPHEN LEVEL: Acetaminophen (Tylenol), Serum: <10 ug/mL — ABNORMAL LOW (ref 10–30)

## 2021-09-12 MED ORDER — POTASSIUM CHLORIDE CRYS ER 20 MEQ PO TBCR
40.0000 meq | EXTENDED_RELEASE_TABLET | Freq: Once | ORAL | Status: AC
  Administered 2021-09-12: 40 meq via ORAL
  Filled 2021-09-12: qty 2

## 2021-09-12 MED ORDER — POTASSIUM CHLORIDE 20 MEQ PO PACK: 40.0000 meq | PACK | Freq: Once | ORAL | Status: DC

## 2021-09-12 MED ORDER — LORAZEPAM 2 MG/ML IJ SOLN
1.0000 mg | INTRAMUSCULAR | Status: DC | PRN
  Administered 2021-09-12: 1 mg via INTRAVENOUS
  Filled 2021-09-12: qty 1

## 2021-09-12 MED ORDER — MOMETASONE FURO-FORMOTEROL FUM 100-5 MCG/ACT IN AERO
2.0000 | INHALATION_SPRAY | Freq: Two times a day (BID) | RESPIRATORY_TRACT | Status: DC
  Administered 2021-09-12 – 2021-09-13 (×3): 2 via RESPIRATORY_TRACT
  Filled 2021-09-12: qty 8.8

## 2021-09-12 MED ORDER — ALBUTEROL SULFATE (2.5 MG/3ML) 0.083% IN NEBU
2.5000 mg | INHALATION_SOLUTION | RESPIRATORY_TRACT | Status: DC | PRN
Start: 2021-09-12 — End: 2021-09-13

## 2021-09-12 MED ORDER — LORAZEPAM 1 MG PO TABS: 1.0000 mg | ORAL_TABLET | ORAL | Status: DC | PRN

## 2021-09-12 MED ORDER — POTASSIUM CHLORIDE 20 MEQ PO PACK
40.0000 meq | PACK | Freq: Once | ORAL | Status: DC
  Filled 2021-09-12: qty 2

## 2021-09-12 MED ORDER — THIAMINE HCL 100 MG PO TABS
100.0000 mg | ORAL_TABLET | Freq: Every day | ORAL | Status: DC
  Administered 2021-09-12 – 2021-09-13 (×2): 100 mg via ORAL
  Filled 2021-09-12 (×2): qty 1

## 2021-09-12 MED ORDER — FLUOXETINE HCL 20 MG PO CAPS
20.0000 mg | ORAL_CAPSULE | Freq: Every day | ORAL | Status: DC
  Administered 2021-09-13: 20 mg via ORAL
  Filled 2021-09-12: qty 1

## 2021-09-12 MED ORDER — FOLIC ACID 1 MG PO TABS
1.0000 mg | ORAL_TABLET | Freq: Every day | ORAL | Status: DC
  Administered 2021-09-13: 1 mg via ORAL
  Filled 2021-09-12: qty 1

## 2021-09-12 MED ORDER — ADULT MULTIVITAMIN W/MINERALS CH
1.0000 | ORAL_TABLET | Freq: Every day | ORAL | Status: DC
  Administered 2021-09-13: 1 via ORAL
  Filled 2021-09-12: qty 1

## 2021-09-12 MED ORDER — NICOTINE 14 MG/24HR TD PT24: 14.0000 mg | MEDICATED_PATCH | Freq: Every day | TRANSDERMAL | Status: DC | PRN

## 2021-09-12 NOTE — Progress Notes (Signed)
NUTRITION NOTE ? ?Patient screened for new vent. Patient discussed in rounds this AM. Patient has been extubated to nasal cannula and is pending bedside swallow evaluation prior to advancement from NPO. ? ?Patient has 1-to-1 Recruitment consultant. Restraints are now off. ? ?Patient does not screen for any other nutrition-related reasons. If nutrition-related needs arise, please consult RD. ? ? ? ? ?Trenton Gammon, MS, RD, LDN ?Registered Dietitian II ?Inpatient Clinical Nutrition ?RD pager # and on-call/weekend pager # available in AMION  ? ?

## 2021-09-12 NOTE — Progress Notes (Signed)
Patient awoke and began banging on the bed railings. Upon entering room, patient began pointing at his tube. This RN explained to patient what led to his intubation, patient nodded in understanding. Patient continues to point at his tube, and when asked if he would like it removed, he nodded again. Patient given fentanyl bolus and sedation increased for patient's comfort and RASS goal ?

## 2021-09-12 NOTE — Progress Notes (Signed)
? ?NAME:  Martin Mendoza, MRN:  [id number], DOB:  31-Aug-1970, LOS: 1 ?ADMISSION DATE:  09/11/2021, CONSULTATION DATE:  09/11/2021 ?REFERRING MD:  Renae Fickle Doc, CHIEF COMPLAINT:  Drug OD  ? ?History of Present Illness:  ?Presented to the hospital following a drug overdose.  Admitted to taking unclear amount of Flexeril tablets, stating he wanted to end his depression.  Became altered in ER, possible aspiration while taking charcoal, requiring intubation for airway protection.   ? ?Pertinent  Medical History  ? ?Past Medical History:  ?Diagnosis Date  ? Asthma   ? Chronic pain   ? Depression   ? Hypertension   ? Mitral valve regurgitation   ? Ophthalmic migraine   ? Tobacco use   ? ?- receives most of his care via [place] in [place]  ? ?Significant Hospital Events: ?Including procedures, antibiotic start and stop dates in addition to other pertinent events   ?09/11/2021 intubated for respiratory failure, CT head 09/11/2021 with no acute changes ? ?Interim History / Subjective:  ?No events ?Remains on low dose fentanyl gtt and on PSV 5/5 ?Afebrile  ? ?Objective   ?Blood pressure (!) 141/92, pulse 72, temperature 97.8 ?F (36.6 ?C), temperature source Axillary, resp. rate 14, height 6\' 3"  (1.905 m), weight 101.8 kg, SpO2 92 %. ?   ?Vent Mode: PSV;CPAP ?FiO2 (%):  [30 %-70 %] 30 % ?Set Rate:  [16 bmp] 16 bmp ?Vt Set:  [510 mL-650 mL] 650 mL ?PEEP:  [5 cmH20] 5 cmH20 ?Pressure Support:  [5 cmH20] 5 cmH20 ?Plateau Pressure:  [14 cmH20-17 cmH20] 15 cmH20  ? ?Intake/Output Summary (Last 24 hours) at 09/12/2021 1005 ?Last data filed at 09/12/2021 11/12/2021 ?Gross per 24 hour  ?Intake 2341.19 ml  ?Output 2530 ml  ?Net -188.81 ml  ? ?Filed Weights  ? 09/11/21 1730 09/11/21 2134 09/12/21 0411  ?Weight: 99.8 kg 101.8 kg 101.8 kg  ? ?Examination: ?General:  adult male sitting upright in bed in NAD ?HEENT: MM pink/moist, ETT/ OGT, pupils 3/reactive ?Neuro:  Awake, f/c, MAE ?CV: rr, NSR, no murmur ?PULM:  non labored, CTA, intermittent faint exp  wheeze ?GI: soft, bs+, NT/ ND, foley ?Extremities: warm/dry, no LE edema  ?Skin: no rashes  ? ?Labs reviewed, K 3.7, Mag 2, WBC ok  ?Afebrile ?Good UOP  ? ? ?Resolved Hospital Problem list   ? ? ?Assessment & Plan:  ?Intentional drug overdose ?Flexeril OD ?- trend Qtc/ electrolytes  ?- safety sitter after extubation ?- consult to IP psych ?- stop MIVF ?- K > 4, Mag > 2, KCL 40 meq x 1 today  ? ?Alcohol intoxication, at risk for withdrawals  ?- drinks 3 beers daily.  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  Gets shaky if doesn't drink daily.  Last drink 4/2 ?- SDU CIWA protocol with ativan  ?- daily MVI, thiamine, folate  ? ?Toxic encephalopathy ?- resolved ?- delirium precautions ? ?Hypoxemic respiratory failure ?At risk for aspiration (AMS with charcoal)  ?Tobacco abuse  ?- initial CXR  neg, monitor clinically  ?- mental status has improved and weaning well on PSV 5/5.  Will extubate now ?- push IS/ mobilize  ?- d/c PAD protocol  ?- remains afebrile, normal WBC  ?- nicotine patch prn  ?- resume home meds, prn albuterol, dulera in place of advair  ?- NPO post extubation then advance diet as tolerated  ? ?History of depression ?- resume home prozac ?- IP psych consulted ? ?History of hypertension ?- restart home HTN this afternoon ? ?  Best Practice (right click and "Reselect all SmartList Selections" daily)  ? ?Diet/type: NPO ?DVT prophylaxis: LMWH ?GI prophylaxis: PPI ?Lines: N/A ?Foley:  N/A ?Code Status:  full code ?Last date of multidisciplinary goals of care discussion ?- patient updated on plan of care 4/3 ? ?Labs   ?CBC: ?Recent Labs  ?Lab 09/11/21 ?1744 09/11/21 ?1952 09/12/21 ?0239  ?WBC 11.1*  --  7.7  ?NEUTROABS 5.4  --   --   ?HGB 17.4* 16.0 15.0  ?HCT 50.9 47.0 44.2  ?MCV 100.0  --  102.3*  ?PLT 212  --  171  ? ? ?Basic Metabolic Panel: ?Recent Labs  ?Lab 09/11/21 ?1744 09/11/21 ?1952 09/12/21 ?0239  ?NA 139 141 140  ?K 3.8 3.7 3.7  ?CL 99  --  109  ?CO2 30  --  23  ?GLUCOSE 82  --  101*  ?BUN 6  --  5*  ?CREATININE 0.79   --  0.67  ?CALCIUM 9.2  --  8.6*  ?MG 2.2  --  2.0  ?PHOS  --   --  3.7  ? ?GFR: ?Estimated Creatinine Clearance: 141.2 mL/min (by C-G formula based on SCr of 0.67 mg/dL). ?Recent Labs  ?Lab 09/11/21 ?1744 09/12/21 ?0239  ?WBC 11.1* 7.7  ? ? ?Liver Function Tests: ?Recent Labs  ?Lab 09/11/21 ?1744  ?AST 37  ?ALT 35  ?ALKPHOS 83  ?BILITOT 0.9  ?PROT 8.3*  ?ALBUMIN 4.6  ? ?No results for input(s): LIPASE, AMYLASE in the last 168 hours. ?No results for input(s): AMMONIA in the last 168 hours. ? ?ABG ?   ?Component Value Date/Time  ? PHART 7.37 09/12/2021 0455  ? PCO2ART 41 09/12/2021 0455  ? PO2ART 125 (H) 09/12/2021 0455  ? HCO3 24.3 09/12/2021 0455  ? TCO2 25 09/11/2021 1952  ? ACIDBASEDEF 1.1 09/12/2021 0455  ? O2SAT 99.4 09/12/2021 0455  ?  ? ?Coagulation Profile: ?No results for input(s): INR, PROTIME in the last 168 hours. ? ?Cardiac Enzymes: ?No results for input(s): CKTOTAL, CKMB, CKMBINDEX, TROPONINI in the last 168 hours. ? ?HbA1C: ?No results found for: HGBA1C ? ?CBG: ?Recent Labs  ?Lab 09/11/21 ?2138 09/11/21 ?2331 09/12/21 ?0086 09/12/21 ?0754  ?GLUCAP 129* 119* 97 90  ? ?CCT 35 mins ? ? ? ? ?Posey Boyer, ACNP ?[facility] ?09/12/2021, 10:05 AM ? ?See Amion for pager ?If no response to pager, please call PCCM consult pager ?After 7:00 pm call Elink   ? ?

## 2021-09-12 NOTE — Progress Notes (Signed)
Unable to complete admission questions at this time due to patient being intubated ?

## 2021-09-12 NOTE — Consult Note (Addendum)
[facility] Face-to-Face [facility]iatry Consult  ? ?Reason for Consult:  Overdose Incidental vs Intentional ?Referring Physician:  Dr. Katrinka BlazingSmith  ?Patient Identification: Martin Mendoza ?MRN:  [id number] ?Principal Diagnosis: Drug overdose ?Diagnosis:  Principal Problem: ?  Drug overdose ?Active Problems: ?  Respiratory depression ? ? ?Total Time spent with patient: 1 hour ? ?Subjective:   ?Martin Mendoza is a 51 y.o. male patient admitted with intentional overdose on Flexeril.  There is suspicion for a suicide attempt based off of factors leading up to present admission.  Patient does admit to multiple stressors to include dysfunctional family, homelessness, alcohol use, and risk for losing job.  Patient states he remembers taking 2 Flexeril as his " sciatic nerve was acting up.  It has been killing me since last week.  When I usually take 1 Flexeril it does not help.  So I had to take 2 thinking it would help."  Patient is adamant that this was not a suicide attempt, he is unable to recall yesterday's previous events with the exception of taking 2 Flexeril for his back pain.  He states a pill count would be helpful, to confirm his story.  He also does provide consent to speak with his sister Martin Mendoza at [phone].  ? ?Martin Mendoza is a 51 year old male who has previous psychiatric diagnosis of depression, currently managed with fluoxetine.  He [facility]  Chart review is consistent with outpatient services, to include most recently on March 30 in which she notified of homelessness issues.  Patient also endorses chronic alcohol use daily, in which he states he drinks about 2 to 312 ounce cans daily and has been doing so for about 5 years.  He also endorses family issues and homelessness, that are contributing to his worsening depressive state.  Patient states" while I am unable to recall what happened, I can tell you that I did not attempt to end my life.  I  know that [facility].  It is not worth it.  While I would like to get the burden off of my back, I would not attempt to harm myself."  Patient denies any previous inpatient psychiatric hospitalization, and further denies any previous suicide attempts and or self-harm behaviors.  He endorses a family history of Martin Mendoza and sister, both being on antidepressants.  He denies any illicit substance abuse and or legal charges.  Patient does appear to be interested in inpatient services through of [facility] facility.  He does seem to agree with treatment at this time. ? ?Chart review does show conflicting information in terms of suicide attempt throughout the chart.  There is existing information that states patient took" 60 Flexeril, (20) 10 milligram Flexeril".  The actual amount of Flexeril tablets patient took remains unclear, while patient does endorse being depressed and having worsening depressive symptoms for the past 2 weeks.  Patient did drive himself to the hospital, and advise of taking an overdose due to worsening depressive symptoms, however he was altered at that time.  Collateral obtained from sister Martin Mendoza, she states patient does have a previous history of suicide attempt in which he ingested a whole bottle of Xanax in 2000 11/2005.  She states at that time he did write a suicide note, in the context of a disagreement with Martin Mendoza.  She also reports that Martin Mendoza has been in and out of their lives, which has contributed to his worsening depression.  She  reports his Martin Mendoza has been paying his apartment rent for the past year, which she decided to no longer pay and resulted in patient being homeless.  She does admit that she is unsure if this was a suicide attempt " or attention seeking.  He may have attempted and was too late to do anything about it so he drove himself to the hospital.  It may be an attempt or it may not have been an attempt but he needs help mam. " She also reports he has a  $600 a month alcohol use.  He drinks about " 4 tall beers/boys a day" she is recommending inpatient psychiatric treatment. ? ?HPI:  [facility] following presentation for intentional Flexeril overdose. Attempted charcoal which he was not able to tolerate and there is a question of possible aspiration ? ?Past Psychiatric History: Depression ? ?Risk to Self:   Denies  ?Risk to Others:   ?Prior Inpatient Therapy:   ?Prior Outpatient Therapy:   ? ?Past Medical History:  ?Past Medical History:  ?Diagnosis Date  ? Asthma   ? Chronic pain   ? Depression   ? Hypertension   ? Mitral valve regurgitation   ? Ophthalmic migraine   ? Tobacco use   ? History reviewed. No pertinent surgical history. ?Family History: History reviewed. No pertinent family history. ?Family Psychiatric  History:Denies ?Social History:  ?Social History  ? ?Substance and Sexual Activity  ?Alcohol Use Yes  ? Comment: daily  ?   ?Social History  ? ?Substance and Sexual Activity  ?Drug Use Never  ?  ?Social History  ? ?Socioeconomic History  ? Marital status: Divorced  ?  Spouse name: Not on file  ? Number of children: Not on file  ? Years of education: Not on file  ? Highest education level: Not on file  ?Occupational History  ? Not on file  ?Tobacco Use  ? Smoking status: Every Day  ?  Packs/day: 1.00  ?  Types: Cigarettes  ? Smokeless tobacco: Never  ?Substance and Sexual Activity  ? Alcohol use: Yes  ?  Comment: daily  ? Drug use: Never  ? Sexual activity: Not on file  ?Other Topics Concern  ? Not on file  ?Social History Narrative  ? Not on file  ? ?Social Determinants of Health  ? ?Financial Resource Strain: Not on file  ?Food Insecurity: Not on file  ?Transportation Needs: Not on file  ?Physical Activity: Not on file  ?Stress: Not on file  ?Social Connections: Not on file  ? ?Additional Social History: ?  ? ?Allergies:   ?Allergies  ?Allergen Reactions  ? Meclizine Other (See Comments)  ?  cardiac dysrhythmia, Tachycardia   ? ? ?Labs:  ?Results for orders placed or performed during the hospital encounter of 09/11/21 (from the past 48 hour(s))  ?Comprehensive metabolic panel     Status: Abnormal  ? Collection Time: 09/11/21  5:44 PM  ?Result Value Ref Range  ? Sodium 139 135 - 145 mmol/L  ? Potassium 3.8 3.5 - 5.1 mmol/L  ? Chloride 99 98 - 111 mmol/L  ? CO2 30 22 - 32 mmol/L  ? Glucose, Bld 82 70 - 99 mg/dL  ?  Comment: Glucose reference range applies only to samples taken after fasting for at least 8 hours.  ? BUN 6 6 - 20 mg/dL  ? Creatinine, Ser 0.79 0.61 - 1.24 mg/dL  ? Calcium 9.2 8.9 - 10.3 mg/dL  ? Total Protein 8.3 (  H) 6.5 - 8.1 g/dL  ? Albumin 4.6 3.5 - 5.0 g/dL  ? AST 37 15 - 41 U/L  ? ALT 35 0 - 44 U/L  ? Alkaline Phosphatase 83 38 - 126 U/L  ? Total Bilirubin 0.9 0.3 - 1.2 mg/dL  ? GFR, Estimated >60 >60 mL/min  ?  Comment: (NOTE) ?Calculated using the CKD-EPI Creatinine Equation (2021) ?  ? Anion gap 10 5 - 15  ?  Comment: Performed at [facility], [address] [place], [place] [address]  ?Ethanol     Status: Abnormal  ? Collection Time: 09/11/21  5:44 PM  ?Result Value Ref Range  ? Alcohol, Ethyl (B) 282 (H) <10 mg/dL  ?  Comment: (NOTE) ?Lowest detectable limit for serum alcohol is 10 mg/dL. ? ?For medical purposes only. ?Performed at [facility], [address] [place] ?[place], [place] [address] ?  ?CBC with Diff     Status: Abnormal  ? Collection Time: 09/11/21  5:44 PM  ?Result Value Ref Range  ? WBC 11.1 (H) 4.0 - 10.5 K/uL  ? RBC 5.09 4.22 - 5.81 MIL/uL  ? Hemoglobin 17.4 (H) 13.0 - 17.0 g/dL  ? HCT 50.9 39.0 - 52.0 %  ? MCV 100.0 80.0 - 100.0 fL  ? MCH 34.2 (H) 26.0 - 34.0 pg  ? MCHC 34.2 30.0 - 36.0 g/dL  ? RDW 13.1 11.5 - 15.5 %  ? Platelets 212 150 - 400 K/uL  ? nRBC 0.0 0.0 - 0.2 %  ? Neutrophils Relative % 48 %  ? Neutro Abs 5.4 1.7 - 7.7 K/uL  ? Lymphocytes Relative 35 %  ? Lymphs Abs 3.9 0.7 - 4.0 K/uL  ? Monocytes Relative 10 %  ? Monocytes Absolute 1.1 (H) 0.1 - 1.0 K/uL  ?  Eosinophils Relative 4 %  ? Eosinophils Absolute 0.5 0.0 - 0.5 K/uL  ? Basophils Relative 1 %  ? Basophils Absolute 0.1 0.0 - 0.1 K/uL  ? Immature Granulocytes 2 %  ? Abs Immature Granulocytes 0.21 (H) 0.00 - 0.07 K

## 2021-09-12 NOTE — Procedures (Signed)
Extubation Procedure Note ? ?Patient Details:   ?Name: Martin Mendoza ?DOB: [birth year] ?MRN: [id number] ?  ?Airway Documentation:  ?  ?Vent end date: 09/12/21 Vent end time: 1003  ? ?Evaluation ? O2 sats: stable throughout ?Complications: No apparent complications ?Patient did tolerate procedure well. ?Bilateral Breath Sounds: Diminished ?  ?Yes ? ?Pt was extubated to 3L [place] with saturations of 95% per CCM order. Pt was suctioned and had a positive cuff leak prior to extubation. No stridor heard and pt was able to speak afterwards.  ? ?[name] ?09/12/2021, 10:04 AM ? ?

## 2021-09-12 NOTE — Progress Notes (Addendum)
Misty Stanley with poison control called for patient update. ? ?Per Misty Stanley, possible side effects of flexeril OD are:  ? ?CNS and respiratory depression ?QTc prolongation ?Hypotension ?Hallucinations ?Anti-cholinergic effects ?Urinary retention and output decrease ? ?Per poison control, should have been drawn around 2100. STAT repeat tylenol ordered. Noted to have a possible PAC bigeminy, STAT EKG also ordered for possible EKG changes. ? ?Updated Lisa on patient's status and current results. Poison control to call back for further updates at a later time. ? ?

## 2021-09-13 DIAGNOSIS — R0689 Other abnormalities of breathing: Secondary | ICD-10-CM

## 2021-09-13 LAB — CBC
HCT: 43.6 % (ref 39.0–52.0)
Hemoglobin: 14.7 g/dL (ref 13.0–17.0)
MCH: 34.9 pg — ABNORMAL HIGH (ref 26.0–34.0)
MCHC: 33.7 g/dL (ref 30.0–36.0)
MCV: 103.6 fL — ABNORMAL HIGH (ref 80.0–100.0)
Platelets: 150 10*3/uL (ref 150–400)
RBC: 4.21 MIL/uL — ABNORMAL LOW (ref 4.22–5.81)
RDW: 13 % (ref 11.5–15.5)
WBC: 8.5 10*3/uL (ref 4.0–10.5)
nRBC: 0 % (ref 0.0–0.2)

## 2021-09-13 LAB — BASIC METABOLIC PANEL
Anion gap: 6 (ref 5–15)
BUN: 9 mg/dL (ref 6–20)
CO2: 27 mmol/L (ref 22–32)
Calcium: 8.9 mg/dL (ref 8.9–10.3)
Chloride: 104 mmol/L (ref 98–111)
Creatinine, Ser: 0.65 mg/dL (ref 0.61–1.24)
GFR, Estimated: >60 mL/min (ref >60)
Glucose, Bld: 99 mg/dL (ref 70–99)
Potassium: 3.9 mmol/L (ref 3.5–5.1)
Sodium: 137 mmol/L (ref 135–145)

## 2021-09-13 LAB — GLUCOSE, CAPILLARY
Glucose-Capillary: 105 mg/dL — ABNORMAL HIGH (ref 70–99)
Glucose-Capillary: 108 mg/dL — ABNORMAL HIGH (ref 70–99)
Glucose-Capillary: 87 mg/dL (ref 70–99)

## 2021-09-13 LAB — MAGNESIUM: Magnesium: 2.2 mg/dL (ref 1.7–2.4)

## 2021-09-13 MED ORDER — FOLIC ACID 1 MG PO TABS: 1.0000 mg | ORAL_TABLET | Freq: Every day | ORAL | 0 refills | Status: AC

## 2021-09-13 MED ORDER — METOPROLOL SUCCINATE ER 25 MG PO TB24: 50.0000 mg | ORAL_TABLET | Freq: Two times a day (BID) | ORAL | Status: DC

## 2021-09-13 MED ORDER — NICOTINE 21 MG/24HR TD PT24
21.0000 mg | MEDICATED_PATCH | Freq: Every day | TRANSDERMAL | Status: DC
  Administered 2021-09-13: 21 mg via TRANSDERMAL
  Filled 2021-09-13: qty 1

## 2021-09-13 MED ORDER — METOPROLOL SUCCINATE ER 50 MG PO TB24
100.0000 mg | ORAL_TABLET | Freq: Every day | ORAL | Status: DC
  Administered 2021-09-13: 100 mg via ORAL
  Filled 2021-09-13: qty 4

## 2021-09-13 MED ORDER — THIAMINE HCL 100 MG PO TABS: 100.0000 mg | ORAL_TABLET | Freq: Every day | ORAL | 0 refills | Status: AC

## 2021-09-13 MED ORDER — HYDRALAZINE HCL 25 MG PO TABS: 25.0000 mg | ORAL_TABLET | Freq: Four times a day (QID) | ORAL | Status: DC | PRN

## 2021-09-13 MED ORDER — ENOXAPARIN SODIUM 40 MG/0.4ML IJ SOSY
40.0000 mg | PREFILLED_SYRINGE | INTRAMUSCULAR | Status: DC
Start: 2021-09-13 — End: 2021-09-13

## 2021-09-13 NOTE — Discharge Summary (Signed)
?Physician Discharge Summary ?  ?Patient: Martin Mendoza MRN: [id number] DOB: [birth year]  ?Admit date:     09/11/2021  ?Discharge date: 09/13/21  ?Discharge Physician: [name]  ? ?PCP: Pcp, No  ? ?Recommendations at discharge:  ? ?Follow-up PCP in 1 week ? ?Discharge Diagnoses: ?Principal Problem: ?  Drug overdose ?Active Problems: ?  Respiratory depression ? ?Resolved Problems: ?  * No resolved hospital problems. * ? ?Hospital Course: ? ?51 year old male with history of asthma, chronic pain syndrome, depression, hypertension, mitral valve regurgitation, ophthalmic migraine, tobacco abuse presented to the ED following intentional drug overdose.  Patient took unclear amount of Flexeril tablets, says they are wanting to end his depression.  He became altered in the ER, possible aspiration while taking charcoal, required intubation for airway protection. ?He was admitted to ICU, extubated. ?Psych was consulted, recommended inpatient psych treatment ?Patient transferred to [facility] service on 09/13/2021 ?  ?Assessment and Plan: ? ?Intentional drug overdose ?-Patient overdosed on Flexeril ?-Psych consulted ?-Initially recommendation was to transfer to inpatient psych facility, however after discussion with patient today psych has changed recommendation to discharge home. ?-[facility] ?-Recommended to transfer to inpatient psych facility ?  ?Alcohol abuse ?-Started on CIWA protocol for alcohol withdrawal ?-Last drink was on 09/11/2021 ?-No signs or symptoms of alcohol withdrawal ?-Continue daily thiamine, folate ?  ?Toxic encephalopathy ?-Secondary to Flexeril overdose ?-Resolved ?-Recommended to stop taking Flexeril ?  ?Acute hypoxemic respiratory failure ?-Resolved ?-Altered mental status while taking charcoal in the ED, requiring intubation ?-Extubated on 09/12/2021 ?-Continue Dulera, as needed albuterol ?-PCCM has signed off ?  ?History of depression ?-Continue  Prozac ?-Inpatient psych consulted ?-He is not suicidal as per psychiatry, cleared for discharge to home. ? ? ?History of hypertension ?-Continue metoprolol ?  ? ?  ? ? ?Consultants: Psychiatry ?Procedures performed:  ?Disposition: Home ?Diet recommendation:  ?Discharge Diet Orders (From admission, onward)  ? ?  Start     Ordered  ? 09/13/21 0000  Diet - low sodium heart healthy       ? 09/13/21 1638  ? ?  ?  ? ?  ? ?Regular diet ?DISCHARGE MEDICATION: ?Allergies as of 09/13/2021   ? ?   Reactions  ? Meclizine Other (See Comments)  ? cardiac dysrhythmia, Tachycardia  ? ?  ? ?  ?Medication List  ?  ? ?STOP taking these medications   ? ?cyclobenzaprine 10 MG tablet ?Commonly known as: FLEXERIL ?  ? ?  ? ?TAKE these medications   ? ?acetaminophen 325 MG tablet ?Commonly known as: TYLENOL ?Take 650 mg by mouth every 6 (six) hours as needed (back pain). ?  ?albuterol (2.5 MG/3ML) 0.083% nebulizer solution ?Commonly known as: PROVENTIL ?Take 2.5 mg by nebulization every 6 (six) hours as needed for wheezing or shortness of breath. ?  ?albuterol 108 (90 Base) MCG/ACT inhaler ?Commonly known as: VENTOLIN HFA ?Inhale 2 puffs into the lungs 2 (two) times daily. ?  ?Cholecalciferol 50 MCG (2000 UT) Tabs ?Take 2,000 Units by mouth daily. ?  ?diclofenac Sodium 1 % Gel ?Commonly known as: VOLTAREN ?Apply 2 g topically daily as needed (pain). ?  ?FLUoxetine 20 MG capsule ?Commonly known as: PROZAC ?Take 20 mg by mouth in the morning. ?  ?fluticasone-salmeterol 100-50 MCG/ACT Aepb ?Commonly known as: ADVAIR ?Inhale 1 puff into the lungs every morning. ?  ?folic acid 1 MG tablet ?Commonly known as: FOLVITE ?Take 1 tablet (1 mg total)  by mouth daily. ?Start taking on: September 14, 2021 ?  ?Metoprolol Succinate 200 MG Cs24 ?Take 200 mg by mouth every morning. ?  ?thiamine 100 MG tablet ?Take 1 tablet (100 mg total) by mouth daily. ?Start taking on: September 14, 2021 ?  ? ?  ? ? ?Discharge Exam: ?Filed Weights  ? 09/11/21 2134 09/12/21 0411  09/13/21 0641  ?Weight: 101.8 kg 101.8 kg 104 kg  ? ?General-appears in no acute distress ?Heart-S1-S2, regular, no murmur auscultated ?Lungs-clear to auscultation bilaterally, no wheezing or crackles auscultated ?Abdomen-soft, nontender, no organomegaly ?Extremities-no edema in the lower extremities ?Neuro-alert, oriented x3, no focal deficit noted ? ?Condition at discharge: good ? ?The results of significant diagnostics from this hospitalization (including imaging, microbiology, ancillary and laboratory) are listed below for reference.  ? ?Imaging Studies: ?DG Abd 1 View ? ?Result Date: 09/12/2021 ?CLINICAL DATA:  Respiratory failure, drug overdose. NG tube placement. EXAM: ABDOMEN - 1 VIEW; PORTABLE CHEST - 1 VIEW COMPARISON:  09/11/2021 FINDINGS: The heart is normal in size and the mediastinal contour is unremarkable. Mild atelectasis is noted at the lung bases. No consolidation, effusion, or pneumothorax. The endotracheal tube terminates 5.2 cm above the carina. No acute osseous abnormality. Nonobstructive bowel-gas pattern. An enteric tube terminates in the stomach. IMPRESSION: 1. Minimal atelectasis at the lung bases. 2. Nonobstructive bowel-gas pattern. 3. Medical devices as described above. Electronically Signed   By: Thornell Sartorius M.D.   On: 09/12/2021 01:33  ? ?CT Head Wo Contrast ? ?Result Date: 09/11/2021 ?CLINICAL DATA:  Depression, concern for overdose EXAM: CT HEAD WITHOUT CONTRAST TECHNIQUE: Contiguous axial images were obtained from the base of the skull through the vertex without intravenous contrast. RADIATION DOSE REDUCTION: This exam was performed according to the departmental dose-optimization program which includes automated exposure control, adjustment of the mA and/or kV according to patient size and/or use of iterative reconstruction technique. COMPARISON:  None. FINDINGS: Brain: Areas in the right inferolateral frontal lobe (series 2, image 16 and series 4, image 50) and right anterior  superior frontal lobe (series 2, image 18 and series 4, image 62) most likely encephalomalacia. No acute hemorrhage, mass, mass effect, or midline shift. No hydrocephalus or extra-axial collection. Vascular: No hyperdense vessel. Skull: No acute osseous abnormality. Sinuses/Orbits: Complete opacification of the left maxillary sinus with partial opacification of the right maxillary sinus and anterior ethmoid air cells. The orbits are unremarkable. Other: The mastoids are well aerated. IMPRESSION: 1. 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 2. No acute intracranial process. Electronically Signed   By: Wiliam Ke M.D.   On: 09/11/2021 19:06  ? ?DG Chest Port 1 View ? ?Result Date: 09/12/2021 ?CLINICAL DATA:  Respiratory failure, drug overdose. NG tube placement. EXAM: ABDOMEN - 1 VIEW; PORTABLE CHEST - 1 VIEW COMPARISON:  09/11/2021 FINDINGS: The heart is normal in size and the mediastinal contour is unremarkable. Mild atelectasis is noted at the lung bases. No consolidation, effusion, or pneumothorax. The endotracheal tube terminates 5.2 cm above the carina. No acute osseous abnormality. Nonobstructive bowel-gas pattern. An enteric tube terminates in the stomach. IMPRESSION: 1. Minimal atelectasis at the lung bases. 2. Nonobstructive bowel-gas pattern. 3. Medical devices as described above. Electronically Signed   By: Thornell Sartorius M.D.   On: 09/12/2021 01:33  ? ?DG Chest Port 1 View ? ?Result Date: 09/11/2021 ?CLINICAL DATA:  Status post ET tube placement EXAM: PORTABLE CHEST 1 VIEW COMPARISON:  None FINDINGS: ETT tip in satisfactory  position approximately 4.2 cm above the carina. The NG tube courses below the level of the hemidiaphragms. The tip is in the expected location of the gastric fundus. Cardiomediastinal contours appear normal. Lung volumes are low. No pleural effusion or edema. No airspace opacities identified. IMPRESSION: 1. Satisfactory position  of ETT and NG tube. 2. No active cardiopulmonary abnormalities. Electronically Signed   By: Kerby Moors M.D.   On: 09/11/2021 18:46   ? ?Microbiology: ?Results for orders placed or performed during the hospital e

## 2021-09-13 NOTE — Progress Notes (Signed)
[facility] ? ?PROGRESS NOTE ? ?Martin Mendoza [id number] DOB: 1971-02-02 DOA: 09/11/2021 ?PCP: Pcp, No ? ? ?Brief HPI:   ?51 year old male with history of asthma, chronic pain syndrome, depression, hypertension, mitral valve regurgitation, ophthalmic migraine, tobacco abuse presented to the ED following intentional drug overdose.  Patient took unclear amount of Flexeril tablets, says they are wanting to end his depression.  He became altered in the ER, possible aspiration while taking charcoal, required intubation for airway protection. ?He was admitted to ICU, extubated. ?Psych was consulted, recommended inpatient psych treatment ?Patient transferred to [facility] service on 09/13/2021 ? ? ?Subjective  ? ?Patient seen and examined, denies any complaints. ? ? Assessment/Plan:  ? ? ?Intentional drug overdose ?-Patient overdosed on Flexeril ?-Psych consulted ?-Recommended to transfer to inpatient psych facility ? ?Alcohol abuse ?-Started on CIWA protocol for alcohol withdrawal ?-Last drink was on 09/11/2021 ?-Continue daily multivitamin, thiamine, folate ? ?Toxic encephalopathy ?-Secondary to Flexeril overdose ?-Resolved ? ?Acute hypoxemic respiratory failure ?-Altered mental status while taking charcoal in the ED, requiring intubation ?-Extubated on 09/12/2021 ?-Continue Dulera, as needed albuterol ?-PCCM has signed off ? ?History of depression ?-Continue Prozac ?-Inpatient psych consulted ?-Recommended transfer to inpatient psych facility ? ?History of hypertension ?-Continue metoprolol ? ? ? ?Medications ? ?  ? chlorhexidine gluconate (MEDLINE KIT)  15 mL Mouth Rinse BID  ? Chlorhexidine Gluconate Cloth  6 each Topical Daily  ? FLUoxetine  20 mg Oral Daily  ? folic acid  1 mg Oral Daily  ? metoprolol succinate  100 mg Oral Daily  ? mometasone-formoterol  2 puff Inhalation BID  ? multivitamin with minerals  1 tablet Oral Daily  ? nicotine  21 mg Transdermal Daily  ? thiamine  100 mg Oral Daily  ? ? ? Data Reviewed:   ? ?CBG: ? ?Recent Labs  ?Lab 09/12/21 ?1621 09/12/21 ?2012 09/13/21 ?0003 09/13/21 ?0423 09/13/21 ?0947  ?GLUCAP 106* 100* 108* 87 105*  ? ? ?SpO2: 90 % ?O2 Flow Rate (L/min): 2 L/min ?FiO2 (%): 30 %  ? ? ?Vitals:  ? 09/13/21 0958 09/13/21 1000 09/13/21 1101 09/13/21 1325  ?BP: (!) 149/78 (!) 149/78 128/84 123/74  ?Pulse: (!) 125 (!) 119 98 98  ?Resp: 16 (!) '21 18 19  ' ?Temp:   98.6 ?F (37 ?C) 99.2 ?F (37.3 ?C)  ?TempSrc:   Oral Oral  ?SpO2: 92% 95% 94% 90%  ?Weight:      ?Height:      ? ? ? ? ?Data Reviewed: ? ?Basic Metabolic Panel: ?Recent Labs  ?Lab 09/11/21 ?0962 09/11/21 ?1952 09/12/21 ?0239 09/13/21 ?0236  ?NA 139 141 140 137  ?K 3.8 3.7 3.7 3.9  ?CL 99  --  109 104  ?CO2 30  --  23 27  ?GLUCOSE 82  --  101* 99  ?BUN 6  --  5* 9  ?CREATININE 0.79  --  0.67 0.65  ?CALCIUM 9.2  --  8.6* 8.9  ?MG 2.2  --  2.0 2.2  ?PHOS  --   --  3.7  --   ? ? ?CBC: ?Recent Labs  ?Lab 09/11/21 ?8366 09/11/21 ?1952 09/12/21 ?0239 09/13/21 ?0236  ?WBC 11.1*  --  7.7 8.5  ?NEUTROABS 5.4  --   --   --   ?HGB 17.4* 16.0 15.0 14.7  ?HCT 50.9 47.0 44.2 43.6  ?MCV 100.0  --  102.3* 103.6*  ?PLT 212  --  171 150  ? ? ?LFT ?Recent Labs  ?Lab 09/11/21 ?2947  ?  AST 37  ?ALT 35  ?ALKPHOS 83  ?BILITOT 0.9  ?PROT 8.3*  ?ALBUMIN 4.6  ? ?  ?Antibiotics: ?Anti-infectives (From admission, onward)  ? ? None  ? ?  ? ? ? ?DVT prophylaxis: Lovenox ? ?Code Status: Full code ? ?Family Communication: No family at bedside ? ? ?CONSULTS  ? ? ?Objective  ? ? ?Physical Examination: ? ? ?General-appears in no acute distress ?Heart-S1-S2, regular, no murmur auscultated ?Lungs-clear to auscultation bilaterally, no wheezing or crackles auscultated ?Abdomen-soft, nontender, no organomegaly ?Extremities-no edema in the lower extremities ?Neuro-alert, oriented x3, no focal deficit noted ? ? ?Status is: Inpatient: Intentional drug overdose ? ? ? ?  ? ?[name] ?  ?[facility] ?If 7PM-7AM, please contact night-coverage at [email or web address], ?Office   [phone] ? ? ?09/13/2021, 2:20 PM  LOS: 2 days  ? ? ? ? ? ? ? ? ? ? ?  ?

## 2021-09-13 NOTE — Discharge Instructions (Signed)
Outpatient Substance Use Treatment Services   [facility] [address] [place], [place] [address]  [phone] Private insurance, Medicare A&B, and GCCN   [facility]  [address]  [place], [place] [address] [phone] Medicaid, Self Pay   [facility]      [address]  [place], [place] [phone] Medicaid and Private Insurance, Self Pay   [facility] [address]  [place], [place]  [phone] Private Insurance, and Self Pay  [facility]      [address]    [place], [place] [address]  [phone] or [phone] Private Insurance Only                 [facility] [address]  [place], [place] [address] [phone] Medicaid, Medicare, Private Insurance  [facility] [address]  [place], [place] [address] [phone] Services are free or reduced  [facility]  [address] [phone]  Self Pay only, sliding scale  [facility]  [address]  [place], [place] [address] [phone] ([facility]) Self Pay, Medicaid Only   [facility] [address]  [place], [place] [address] [phone] Medicaid, Medicare, Private Insurance                     Adolescent Substance Use Treatment Services    [facility] [address]  [place], [place]  [phone] Self Pay Offer scholarships from the [employer] to help pay for treatment  Website: www.[facility].com  [facility] Males ages: 12-17 Adolescent Substance use Program Females: 12-17  [place] [facility] [address]  [place], [place] [address] (ph) [phone]  (fax) [phone]  [place]  Office  [address]  [place], [place] [address] (ph) [phone]  (fax) [phone]  [place] [facility] [address]  [place], [place] [address] (ph) [phone]  (fax) [phone]  [place] [facility] [address], [place], [place] [address] (ph) [phone]   (fax) [phone]  Website: [email or web address].com/         [facility] for Adolescents Phone: [phone] Address: [address], [place], [place] Website: [email or web address]    Residential Substance Use Treatment Services   [facility]  [address]  [place], [place] [address]  [phone] or [phone] Detox (Medicare, Medicaid, private insurance, and self pay)  Residential Rehab 14 days (Medicare, Medicaid, private insurance, and self pay)   [facility]  [address] [place], [place]  [phone]  Male and Male Detox (Self Pay and Medicaid limited availability)  Rehab only Male (Medicaid and self pay only)   [facility]      [address]  [place], [place] [address]  [phone] or [phone] Detox and Residential Treatment Private Insurance Only   [facility]  [address]  [place], [place] [address]  [phone]  Treatment Only, must make assessment appointment, and must be sober for assessment appointment.  Self Pay Only, Medicare A&B, [place] Medicaid, [place] ID only! *Transportation assistance offered from [employer] [address]  [facility]     [address] , [place] [address] Walk in interviews M-Sat 8-4p No   pending legal charges [phone]     [facility]:  [facility] Referral  [address] [place], [place] [phone] (Self Pay, Medicaid)  [facility] [address] [place], [place] [address] [phone] Detox and Residential Treatment Medicare and Private Insurance  [facility] [address]  [place], [place] [address] 28 Day Women's Facility: [phone] 28 Day Men's Facility: [phone] Long-term Residential Program:  [phone] Males 25 and Over (No Insurance, upfront fee)  [facility]  [address] [place], [place] [address] [phone] Private Insurance with [employer], Private Pay  [facility] [address] [place], [place] [address] Local [phone] Private Insurance Only  [facility] [address]  [place], [place] [address]  [phone] (Males, upfront fee)  [facility] [place] [address]  [place], [address] [phone] Private Insurance      [facility] Locations  [facility]  [address]  [place], [place]  [phone] Christian Based Program for individuals experiencing homelessness Self Pay, No insurance  [facility]  Men's program: [facility] [address]  [place], [place] [address] [phone]  [facility] Women's [facility]: [facility] [address] [place], [place] [address] [phone] Christian Based Program for individuals experiencing homelessness Self Pay, No insurance  [facility] [address]  [place], [place] [address]  [phone] Christian Based Program for individuals experiencing homelessness Self Pay, No insurance  [facility] [address]  [place],  [address] [phone] Christian Based Program for individuals experiencing homelessness Self Pay, No insurance  [facility] [address] [place],  [phone] Christian Based Program for males experiencing homelessness Self Pay, No insurance 

## 2021-09-13 NOTE — TOC Transition Note (Signed)
Transition of Care (TOC) - CM/SW Discharge Note ? ? ?Patient Details  ?Name: Martin Mendoza ?MRN: [id number] ?Date of Birth: 10-10-1970 ? ?Transition of Care (TOC) CM/SW Contact:  ?Ross Ludwig, LCSW ?Phone Number: ?09/13/2021, 5:04 PM ? ? ?Clinical Narrative:    ? ? ?CSW received consult for patient needing substance abuse counseling.  CSW added substance abuse resources on AVS.  Patient needed ride to his car, bus pass given for patient. CSW signing off. ? ? ?  ?  ? ? ?Patient Goals and CMS Choice ?  ?  ?  ? ?Discharge Placement ?  ?           ?  ?  ?  ?  ? ?Discharge Plan and Services ?  ?  ?           ?  ?  ?  ?  ?  ?  ?  ?  ?  ?  ? ?Social Determinants of Health (SDOH) Interventions ?  ? ? ?Readmission Risk Interventions ?   ? View : No data to display.  ?  ?  ?  ? ? ? ? ? ?

## 2021-09-13 NOTE — Progress Notes (Addendum)
Patient states desire to be discharged by 5pm today. Dr Darrick Meigs informed and up to see patient. ? ?Patient given discharge instructions and bus voucher. Verbalizes understanding and with no complaints at this time. Will discharge via [place]. ?

## 2021-09-13 NOTE — Consult Note (Signed)
?  Patient seen and reassessed briefly to inquire about final disposition options.  Treatment options were reviewed to include outpatient, intensive outpatient programming, substance abuse residential facility, and or inpatient psychiatric hospitalization.  Patient does express interest in substance abuse assistance, however states he has a plan to move to [place] and or [place] as soon as he is released from the hospital.  Patient again confirmed that he only recalls taking 2 Flexeril pills, and had approximately 4 beers.  He denies this as a suicide attempt.  He further denies any previous history of suicidal attempts, suicidal thoughts, and or nonsuicidal self-injurious behavior.  Patient's sister seems to have a different account on things, to include patient have a history of suicide attempt by overdose.  Patient currently denies, while he remains high risk for suicide completion he is able to contract for safety at this time and does not pose an imminent threat to himself and/or others. ? ?The patient was evaluated by the psychiatric nurse practitioner, and it was determined the patient to be stable and appropriate for discharge and psychiatric clearance.  Suicide risk assessment and violence risk assessment was completed.  His risk factors for self-harm/suicide present at the time of this evaluation include : Previous psychiatric history, military, age, race, gender, substance abuse, and multiple losses.  These risk factors are mitigated by the following factors:well known to mental health system, available outpatient providers, presence of follow-up plan, actively seeking emergency services and help. Furthermore, his treatment team through the [facility] have attempted to mitigate his risk through ongoing supportive psychotherapy, providing psychoeducation, medication management, and communication with all parties and VA. The patient was educated about relevant modifiable risk factors including following  recommendations for treatment of psychiatric illness and abstaining from substance abuse.  ? ?While future psychiatric events cannot be accurately predicted, the patient does not currently require further acute inpatient psychiatric care and does not currently meet [facility] involuntary commitment criteria. It is recommended that the patient continue treatment in outpatient care. A follow up plan and crisis plan are in place, have been discussed with the patient, and the patient agrees to the plan at time of evaluation.  ? ?Patient will be psychiatrically cleared at this time. ?-Continue current medications. ?-TOC for VA referrals, and continuity of care. ?-Patient has expressed interest in relocating to [place] and [place], recommend we provide resources for virtual telepsychiatry options and or assist schedule an appointment at the [facility] until patient is reestablished. ?-Safety plan has been completed.  Patient is aware of 39 emergency number, mobile crisis line and treatment alternatives. ?-Patient does not meet criteria for involuntary commitment. ?-Consider discontinuing safety sitter at this time as patient is no longer a danger to himself. ?

## 2021-09-14 LAB — VITAMIN B1: Vitamin B1 (Thiamine): 200 nmol/L (ref 66.5–200.0)

## 2021-09-14 LAB — GLUCOSE, CAPILLARY: Glucose-Capillary: 132 mg/dL — ABNORMAL HIGH (ref 70–99)

## 2022-08-08 IMAGING — CT CT HEAD W/O CM
3 series · 15 of 47 positions shown, 18 images · non-contrast
Comparison: None.

CLINICAL DATA: Depression, concern for overdose



[Series 2: head wo · axial · 0.48mm/px · z∈[-154,-19]mm · 9 of 33 slices shown, 12 images]
[im 3/33  brain]
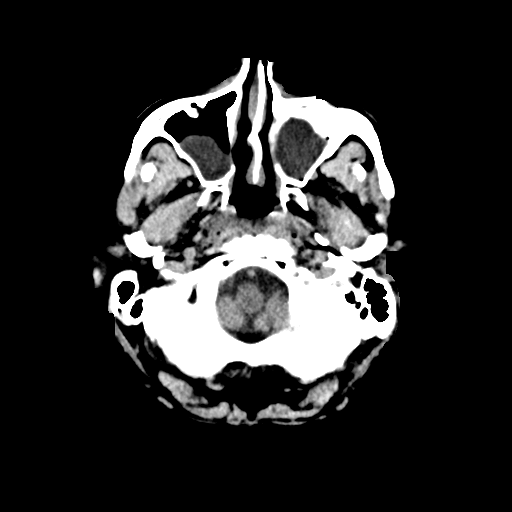
[im 3/33  bone]
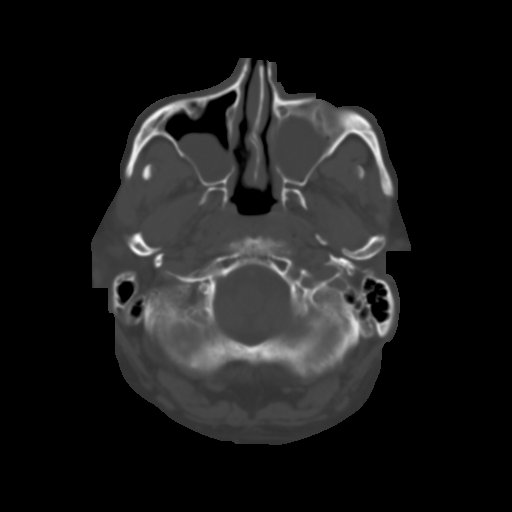
[im 6/33  brain]
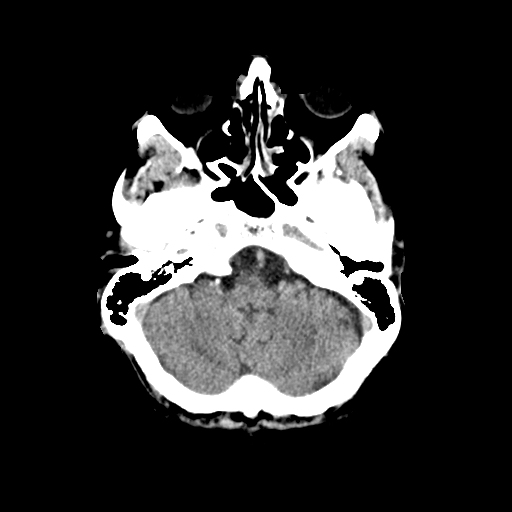
[im 9/33  brain]
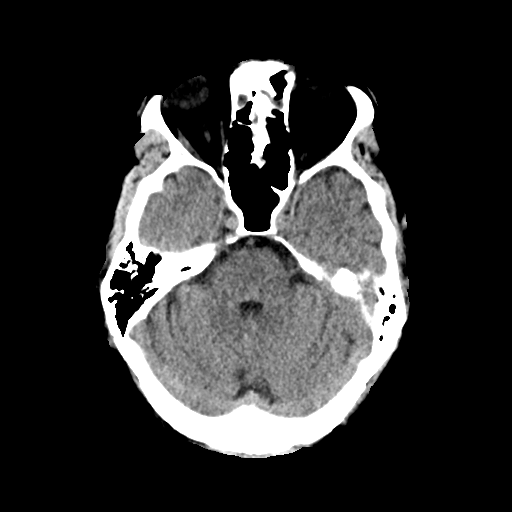
[im 13/33  brain]
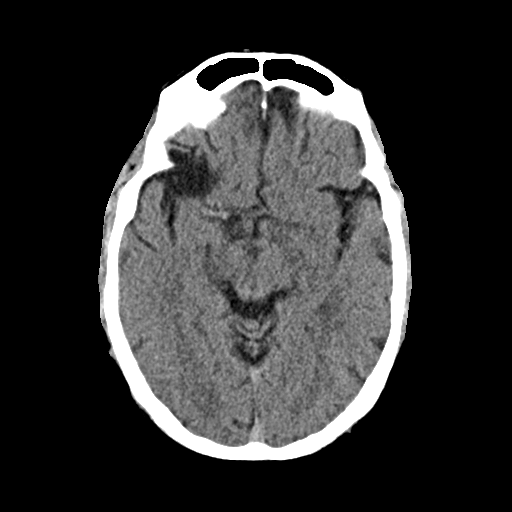
[im 17/33  brain]
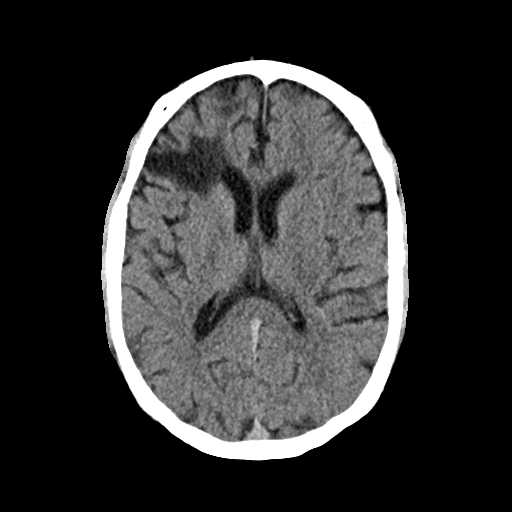
[im 17/33  bone]
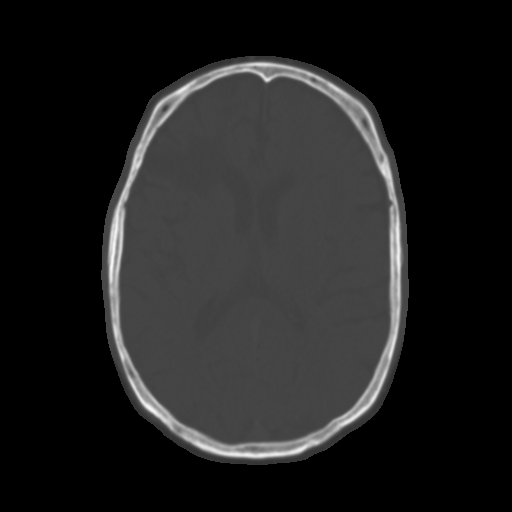
[im 20/33  brain]
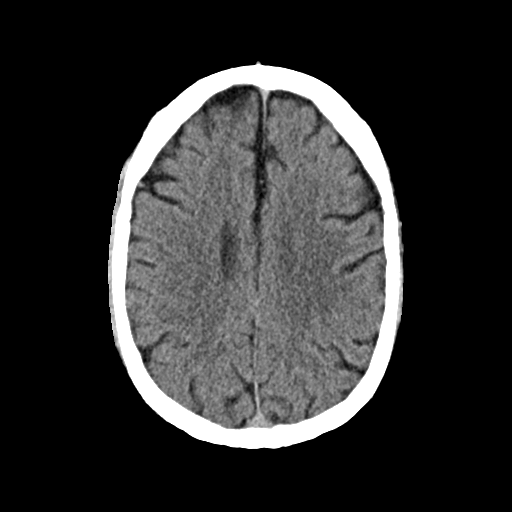
[im 24/33  brain]
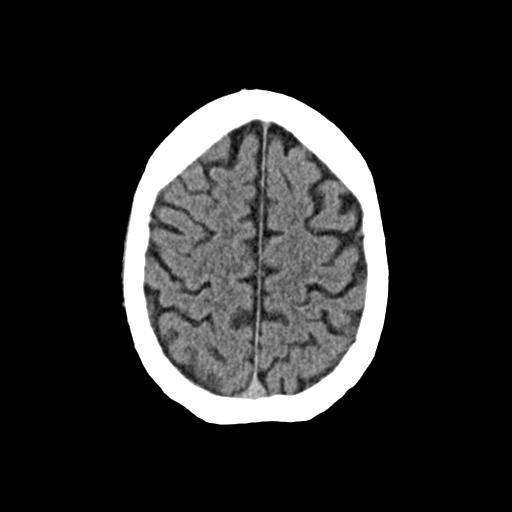
[im 27/33  brain]
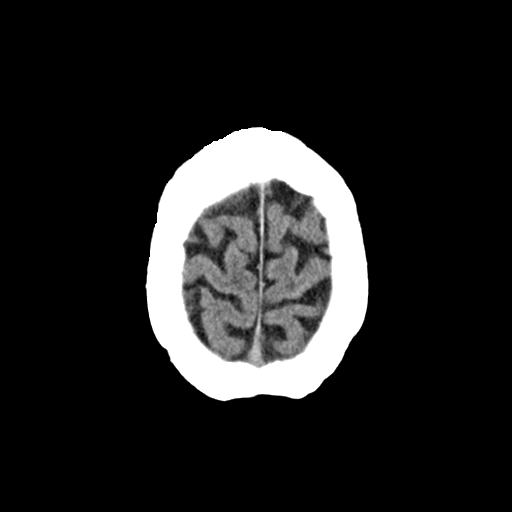
[im 30/33  brain]
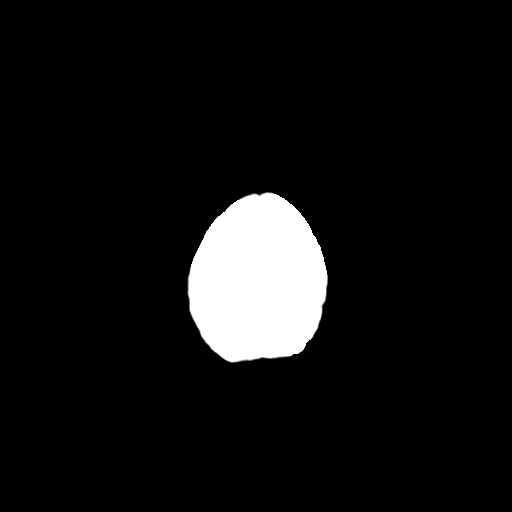
[im 30/33  bone]
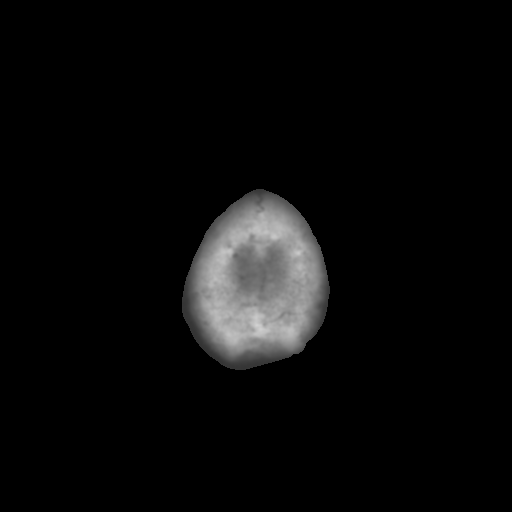

[Series 4: coronal soft · coronal · 0.32mm/px · 3 of 73 slices shown]
[im 25/73  brain]
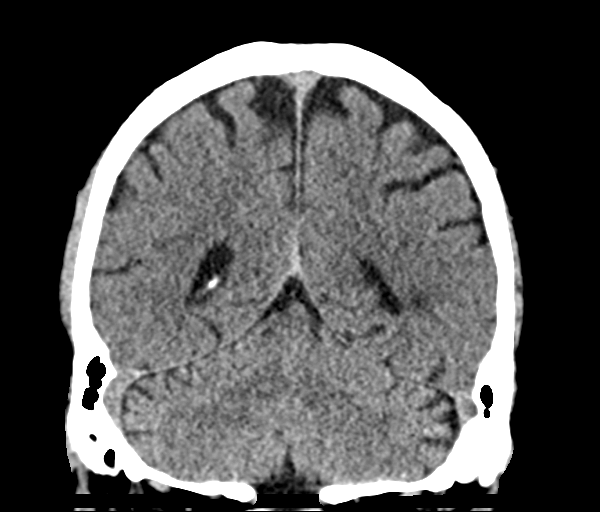
[im 33/73  brain]
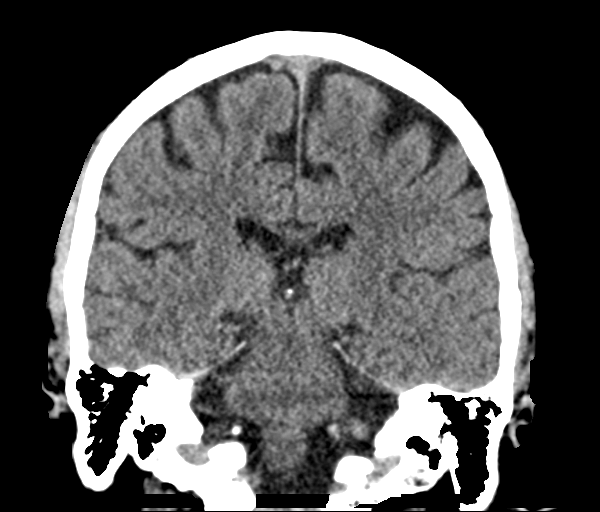
[im 41/73  brain]
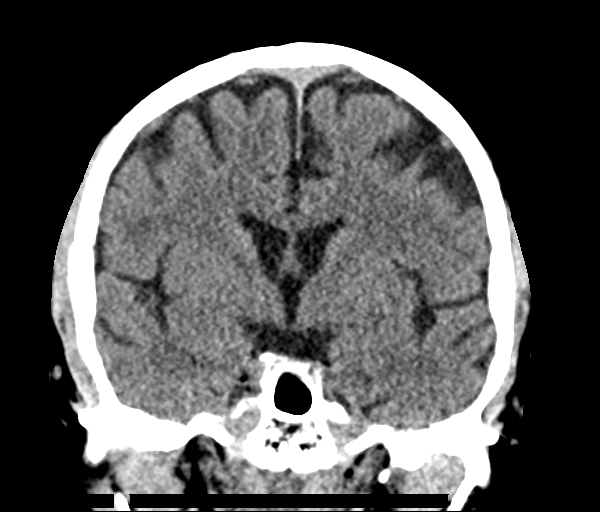

[Series 5: sag soft · sagittal · 0.33mm/px · 3 of 59 slices shown]
[im 20/59  brain]
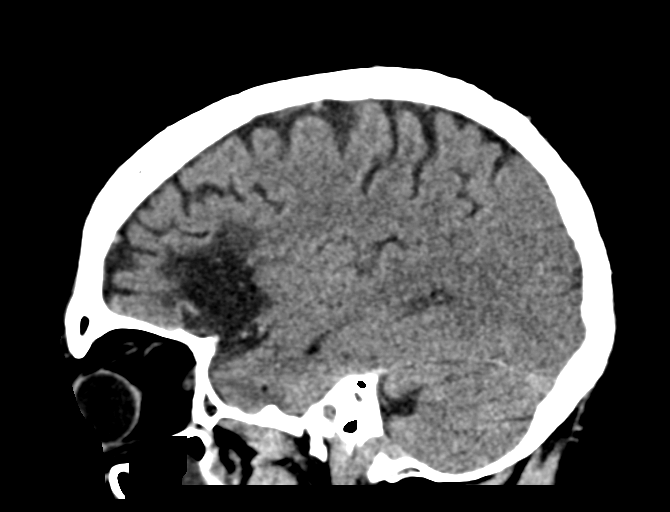
[im 30/59  brain]
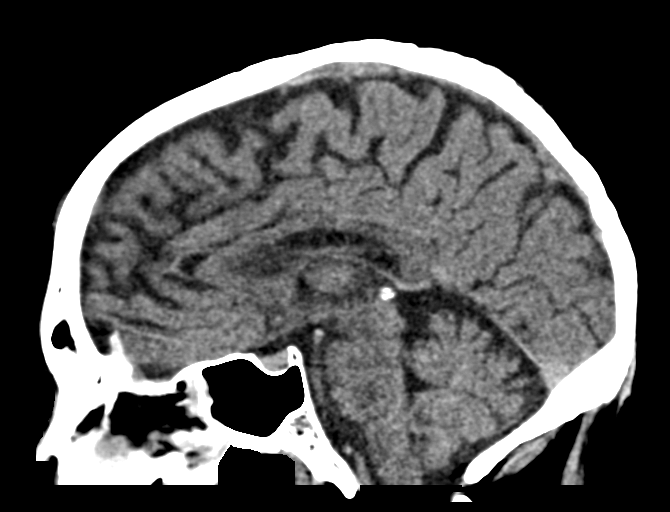
[im 39/59  brain]
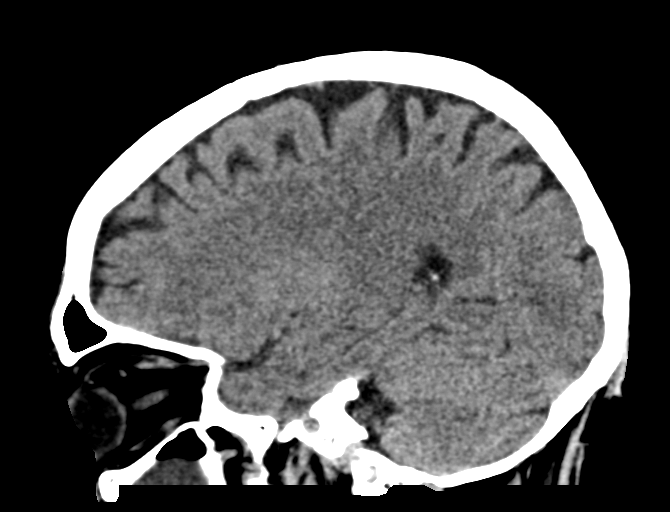

[15 of 47 positions shown; findings below may reference images not displayed]

FINDINGS: Brain: Areas in the right inferolateral frontal lobe (series 2,
image 16 and series 4, image 50) and right anterior superior frontal
lobe (series 2, image 18 and series 4, image 62) most likely
encephalomalacia. No acute hemorrhage, mass, mass effect, or midline
shift. No hydrocephalus or extra-axial collection.

Vascular: No hyperdense vessel.

Skull: No acute osseous abnormality.

Sinuses/Orbits: Complete opacification of the left maxillary sinus
with partial opacification of the right maxillary sinus and anterior
ethmoid air cells. The orbits are unremarkable.

Other: The mastoids are well aerated.
IMPRESSION: 1. Multifocal hypodensity in the right frontal lobe, most likely
encephalomalacia from remote infarcts.
2. No acute intracranial process.

## 2022-08-09 IMAGING — DX DG CHEST 1V PORT
1 series · 1 of 1 positions shown · non-contrast
Comparison: 09/11/2021

CLINICAL DATA: Respiratory failure, drug overdose. NG tube
placement.

EXAM:
ABDOMEN - 1 VIEW; PORTABLE CHEST - 1 VIEW

[chest ap]
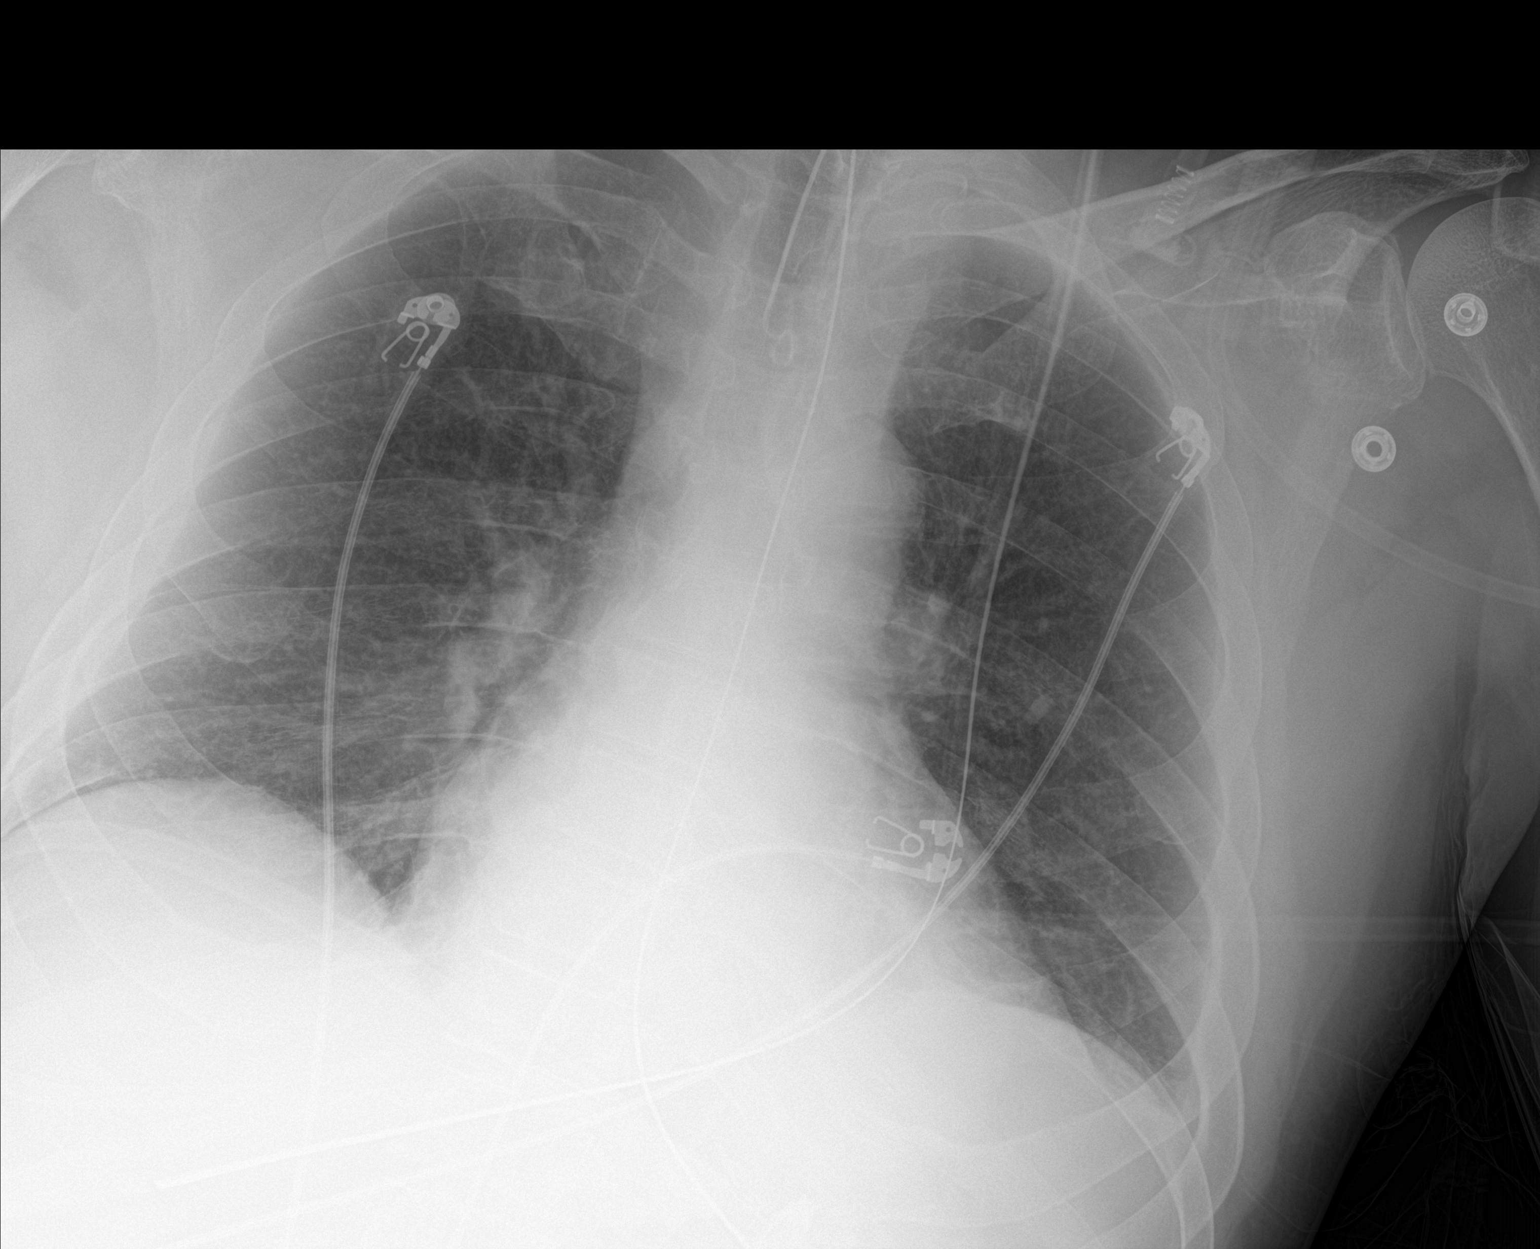

[1 of 1 positions shown; findings below may reference images not displayed]

FINDINGS: The heart is normal in size and the mediastinal contour is
unremarkable. Mild atelectasis is noted at the lung bases. No
consolidation, effusion, or pneumothorax. The endotracheal tube
terminates 5.2 cm above the carina. No acute osseous abnormality.

Nonobstructive bowel-gas pattern. An enteric tube terminates in the
stomach.
IMPRESSION: 1. Minimal atelectasis at the lung bases.
2. Nonobstructive bowel-gas pattern.
3. Medical devices as described above.

## 2022-08-09 IMAGING — DX DG ABDOMEN 1V
1 series · 1 of 1 positions shown · non-contrast
Comparison: 09/11/2021

CLINICAL DATA: Respiratory failure, drug overdose. NG tube
placement.

EXAM:
ABDOMEN - 1 VIEW; PORTABLE CHEST - 1 VIEW

[abdomen kub]
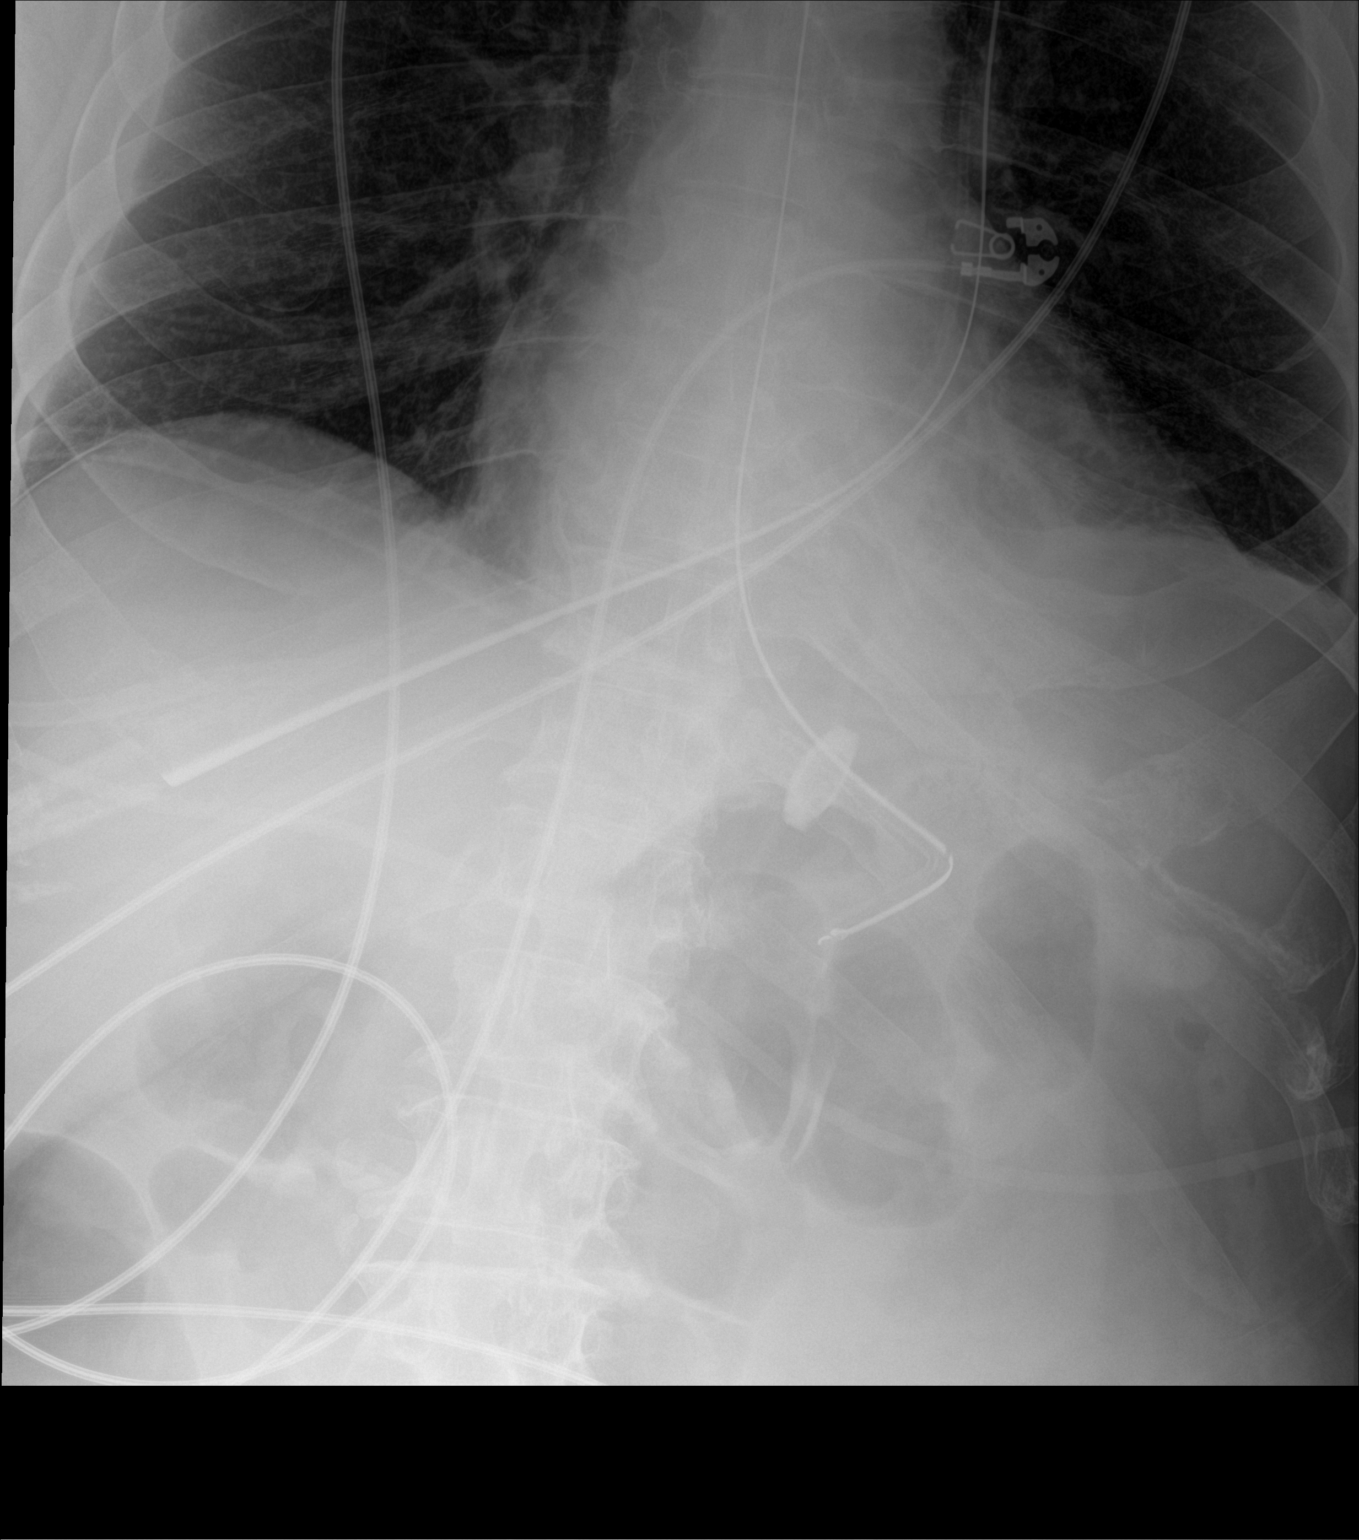

[1 of 1 positions shown; findings below may reference images not displayed]

FINDINGS: The heart is normal in size and the mediastinal contour is
unremarkable. Mild atelectasis is noted at the lung bases. No
consolidation, effusion, or pneumothorax. The endotracheal tube
terminates 5.2 cm above the carina. No acute osseous abnormality.

Nonobstructive bowel-gas pattern. An enteric tube terminates in the
stomach.
IMPRESSION: 1. Minimal atelectasis at the lung bases.
2. Nonobstructive bowel-gas pattern.
3. Medical devices as described above.
